# Patient Record
Sex: Female | Born: 1935 | Race: White | Hispanic: No
Health system: Southern US, Community
[De-identification: ages and names within clinical notes are randomized; demographics above are authoritative.]

## PROBLEM LIST (undated history)

## (undated) DIAGNOSIS — K219 Gastro-esophageal reflux disease without esophagitis: Secondary | ICD-10-CM

## (undated) DIAGNOSIS — C801 Malignant (primary) neoplasm, unspecified: Secondary | ICD-10-CM

## (undated) DIAGNOSIS — J189 Pneumonia, unspecified organism: Secondary | ICD-10-CM

## (undated) DIAGNOSIS — M1990 Unspecified osteoarthritis, unspecified site: Secondary | ICD-10-CM

## (undated) DIAGNOSIS — I639 Cerebral infarction, unspecified: Secondary | ICD-10-CM

## (undated) HISTORY — PX: ABDOMINAL HYSTERECTOMY: SHX81

---

## 1997-04-29 ENCOUNTER — Encounter: Admission: RE | Admit: 1997-04-29 | Discharge: 1997-07-28 | Payer: Self-pay | Admitting: Internal Medicine

## 2000-01-09 ENCOUNTER — Emergency Department (HOSPITAL_COMMUNITY): Admission: EM | Admit: 2000-01-09 | Discharge: 2000-01-10 | Payer: Self-pay | Admitting: Emergency Medicine

## 2000-03-07 ENCOUNTER — Encounter (INDEPENDENT_AMBULATORY_CARE_PROVIDER_SITE_OTHER): Payer: Self-pay | Admitting: Specialist

## 2000-03-07 ENCOUNTER — Ambulatory Visit (HOSPITAL_COMMUNITY): Admission: RE | Admit: 2000-03-07 | Discharge: 2000-03-07 | Payer: Self-pay | Admitting: *Deleted

## 2000-04-24 ENCOUNTER — Emergency Department (HOSPITAL_COMMUNITY): Admission: EM | Admit: 2000-04-24 | Discharge: 2000-04-25 | Payer: Self-pay | Admitting: Internal Medicine

## 2000-04-25 ENCOUNTER — Encounter: Payer: Self-pay | Admitting: Emergency Medicine

## 2000-04-30 ENCOUNTER — Inpatient Hospital Stay (HOSPITAL_COMMUNITY): Admission: EM | Admit: 2000-04-30 | Discharge: 2000-05-13 | Payer: Self-pay | Admitting: Emergency Medicine

## 2000-05-01 ENCOUNTER — Encounter: Payer: Self-pay | Admitting: Emergency Medicine

## 2000-05-01 ENCOUNTER — Encounter: Payer: Self-pay | Admitting: Critical Care Medicine

## 2000-05-01 ENCOUNTER — Encounter: Payer: Self-pay | Admitting: Pulmonary Disease

## 2000-05-02 ENCOUNTER — Encounter: Payer: Self-pay | Admitting: Critical Care Medicine

## 2000-05-07 ENCOUNTER — Encounter: Payer: Self-pay | Admitting: Orthopedic Surgery

## 2000-05-07 ENCOUNTER — Encounter: Payer: Self-pay | Admitting: Neurology

## 2000-05-07 ENCOUNTER — Encounter: Payer: Self-pay | Admitting: Internal Medicine

## 2000-05-10 ENCOUNTER — Encounter: Payer: Self-pay | Admitting: Internal Medicine

## 2000-05-13 ENCOUNTER — Inpatient Hospital Stay (HOSPITAL_COMMUNITY)
Admission: RE | Admit: 2000-05-13 | Discharge: 2000-05-24 | Payer: Self-pay | Admitting: Physical Medicine & Rehabilitation

## 2000-05-23 ENCOUNTER — Encounter: Payer: Self-pay | Admitting: Physical Medicine & Rehabilitation

## 2000-06-13 ENCOUNTER — Ambulatory Visit (HOSPITAL_COMMUNITY)
Admission: RE | Admit: 2000-06-13 | Discharge: 2000-06-13 | Payer: Self-pay | Admitting: Physical Medicine & Rehabilitation

## 2000-06-13 ENCOUNTER — Ambulatory Visit (HOSPITAL_COMMUNITY)
Admission: RE | Admit: 2000-06-13 | Discharge: 2000-06-17 | Payer: Self-pay | Admitting: Physical Medicine & Rehabilitation

## 2000-06-13 ENCOUNTER — Encounter: Payer: Self-pay | Admitting: Physical Medicine & Rehabilitation

## 2000-07-25 ENCOUNTER — Ambulatory Visit (HOSPITAL_COMMUNITY)
Admission: RE | Admit: 2000-07-25 | Discharge: 2000-07-25 | Payer: Self-pay | Admitting: Physical Medicine & Rehabilitation

## 2000-07-25 ENCOUNTER — Encounter: Payer: Self-pay | Admitting: Physical Medicine & Rehabilitation

## 2001-04-16 ENCOUNTER — Encounter (INDEPENDENT_AMBULATORY_CARE_PROVIDER_SITE_OTHER): Payer: Self-pay | Admitting: Specialist

## 2001-04-16 ENCOUNTER — Ambulatory Visit (HOSPITAL_COMMUNITY): Admission: RE | Admit: 2001-04-16 | Discharge: 2001-04-16 | Payer: Self-pay | Admitting: *Deleted

## 2003-12-22 ENCOUNTER — Ambulatory Visit (HOSPITAL_COMMUNITY): Admission: RE | Admit: 2003-12-22 | Discharge: 2003-12-22 | Payer: Self-pay | Admitting: *Deleted

## 2004-05-11 ENCOUNTER — Emergency Department (HOSPITAL_COMMUNITY): Admission: EM | Admit: 2004-05-11 | Discharge: 2004-05-12 | Payer: Self-pay | Admitting: Emergency Medicine

## 2004-05-30 ENCOUNTER — Ambulatory Visit (HOSPITAL_COMMUNITY): Admission: RE | Admit: 2004-05-30 | Discharge: 2004-05-30 | Payer: Self-pay | Admitting: *Deleted

## 2004-11-09 ENCOUNTER — Emergency Department (HOSPITAL_COMMUNITY): Admission: EM | Admit: 2004-11-09 | Discharge: 2004-11-09 | Payer: Self-pay | Admitting: Emergency Medicine

## 2004-11-10 ENCOUNTER — Emergency Department (HOSPITAL_COMMUNITY): Admission: EM | Admit: 2004-11-10 | Discharge: 2004-11-11 | Payer: Self-pay | Admitting: Emergency Medicine

## 2004-11-21 ENCOUNTER — Ambulatory Visit (HOSPITAL_COMMUNITY): Admission: RE | Admit: 2004-11-21 | Discharge: 2004-11-21 | Payer: Self-pay | Admitting: *Deleted

## 2005-04-24 ENCOUNTER — Ambulatory Visit (HOSPITAL_COMMUNITY): Admission: RE | Admit: 2005-04-24 | Discharge: 2005-04-24 | Payer: Self-pay | Admitting: *Deleted

## 2005-06-26 ENCOUNTER — Encounter: Admission: RE | Admit: 2005-06-26 | Discharge: 2005-06-26 | Payer: Self-pay | Admitting: Internal Medicine

## 2007-01-31 ENCOUNTER — Emergency Department (HOSPITAL_COMMUNITY): Admission: EM | Admit: 2007-01-31 | Discharge: 2007-02-01 | Payer: Self-pay | Admitting: Emergency Medicine

## 2007-02-19 ENCOUNTER — Ambulatory Visit (HOSPITAL_COMMUNITY): Admission: RE | Admit: 2007-02-19 | Discharge: 2007-02-19 | Payer: Self-pay | Admitting: *Deleted

## 2007-05-16 ENCOUNTER — Emergency Department (HOSPITAL_COMMUNITY): Admission: EM | Admit: 2007-05-16 | Discharge: 2007-05-16 | Payer: Self-pay | Admitting: Emergency Medicine

## 2007-06-25 ENCOUNTER — Ambulatory Visit (HOSPITAL_COMMUNITY): Admission: RE | Admit: 2007-06-25 | Discharge: 2007-06-25 | Payer: Self-pay | Admitting: *Deleted

## 2007-06-27 ENCOUNTER — Ambulatory Visit (HOSPITAL_COMMUNITY): Admission: RE | Admit: 2007-06-27 | Discharge: 2007-06-27 | Payer: Self-pay | Admitting: *Deleted

## 2007-12-02 ENCOUNTER — Ambulatory Visit (HOSPITAL_COMMUNITY): Admission: RE | Admit: 2007-12-02 | Discharge: 2007-12-02 | Payer: Self-pay | Admitting: *Deleted

## 2008-04-28 ENCOUNTER — Ambulatory Visit: Admission: RE | Admit: 2008-04-28 | Discharge: 2008-04-28 | Payer: Self-pay | Admitting: Gynecologic Oncology

## 2008-09-21 ENCOUNTER — Ambulatory Visit (HOSPITAL_COMMUNITY): Admission: RE | Admit: 2008-09-21 | Discharge: 2008-09-21 | Payer: Self-pay | Admitting: Gastroenterology

## 2009-03-10 ENCOUNTER — Ambulatory Visit (HOSPITAL_COMMUNITY): Admission: RE | Admit: 2009-03-10 | Discharge: 2009-03-10 | Payer: Self-pay | Admitting: Gastroenterology

## 2009-04-04 ENCOUNTER — Emergency Department (HOSPITAL_COMMUNITY): Admission: EM | Admit: 2009-04-04 | Discharge: 2009-04-05 | Payer: Self-pay | Admitting: Emergency Medicine

## 2009-04-05 ENCOUNTER — Ambulatory Visit (HOSPITAL_COMMUNITY): Admission: RE | Admit: 2009-04-05 | Discharge: 2009-04-05 | Payer: Self-pay | Admitting: Gastroenterology

## 2010-02-19 ENCOUNTER — Encounter: Payer: Self-pay | Admitting: *Deleted

## 2010-06-13 NOTE — Consult Note (Signed)
[name], [name]            ACCOUNT NO.:  [id number]   MEDICAL RECORD NO.:  [id number]          PATIENT TYPE:  OUT   LOCATION:  GYN                          FACILITY:  [facility]   PHYSICIAN:  John T. Kyla Balzarine, M.D.    DATE OF BIRTH:  May 14, 1930   DATE OF CONSULTATION:  DATE OF DISCHARGE:                                 CONSULTATION   CHIEF COMPLAINT:  This 77 year old woman is seen at the request of Dr.  Henderson Cloud for ovarian cyst.   HISTORY OF PRESENT ILLNESS:  This patient had a CVA in 1980 with  residual left hemiparesis and regurgitation.  She has an indwelling  feeding tube and during repositioning of the feeding tube, CT of abdomen  and pelvis was performed.  This revealed a 6 x 4 cm right adnexal mass.  She has a remote history of vaginal hysterectomy and possible USO in the  1970s for dysfunctional bleeding.  Evaluation by Dr. Henderson Cloud included a  vaginal probe ultrasound which revealed a 4.3 x 4.4 x 2.8 cm septated  ovarian cyst with no hypervascularity or excrescences.  The left ovary  was also identified, containing a 1.3 cm simple cyst.  No fluid was  found in the cul-de-sac.  CA-125 value was obtained and noted to be 9.7.  The patient relates that she has no pelvic pain or other pelvic  symptoms.  There has been no change in her chronic constipation and no  leg swelling or vaginal bleeding.   PAST MEDICAL HISTORY:  1. Significant for a right carotid tumor producing CVA in 1980,      treated with radiation and cancer free.  2. Prior NSVD x2.  3. Vaginal hysterectomy.  4. Gastrostomy.  5. She has osteoporosis.   MEDICATIONS:  Include diazepam, imipramine, ranitidine and monthly  Boniva.   ALLERGIES:  THE PATIENT AND HER SON STATE NONE.   PERSONAL AND SOCIAL HISTORY:  The patient denies tobacco or ethanol use.  She is wheelchair dependent.   FAMILY HISTORY:  Negative for gynecologic or breast cancer.   REVIEW OF SYSTEMS:  Other than noted above, negative 10-point  system  review.   EXAM:  VITAL SIGNS:  Stable and afebrile as recorded in the clinic chart  with blood pressure 110/66.  The patient has marked left hemiparesis with left arm strength  essentially zero and left leg strength approximately 1 out of 4.  The  patient is alert and oriented x3, in no acute distress.  LYMPH:  Survey negative for pathologic lymphadenopathy.  BACK:  No spinous or CVA tenderness.  ABDOMEN:  Soft and benign with a gastrostomy tube in the epigastrium,  without inflammation.  There is no hernia, ascites, tenderness or mass.  EXTREMITIES:  Trace left pretibial edema without cords or Homan's.  PELVIC:  External genitalia and BUS, bladder and urethra normal.  Vagina  is clear.  On bimanual and rectovaginal examinations, there is ample  stool in the rectal ampulla without palpable mass or tenderness.  Absent  uterus and cervix.   ASSESSMENT:  Small, likely benign cyst of the ovary discovered  incidentally at the  time of a CT scan; asymptomatic and with normal CA-  125 value.  Her risk of an ovarian malignancy is less than 1 in 300,  which would be much less than morbidity of even a laparoscopic procedure  in this high-risk patient.   RECOMMENDATIONS:  I had a long discussion with the patient and her son  regarding the nature of this cyst and its likely benignity.  I would not  recommend surgical intervention as this, if anything, is smaller than on  the initial CT scan, although I suspect that this merely reflects  different imaging techniques.  I recommended that the patient be seen  for vaginal probe ultrasound at approximately 63-month intervals by Dr.  Henderson Cloud for least another year and then could be followed perhaps at  annual intervals.  Indications for surgery would include enlargement  greater than 6 cm on TVS, the development of symptoms, or the  development of internal features suspicious for ovarian cancer.  We  would be glad to see her back at any time on  a p.r.n. basis.      John T. Kyla Balzarine, M.D.  Electronically Signed     JTS/MEDQ  D:  04/28/2008  T:  04/28/2008  Job:  [id number]   cc:   Guy Sandifer. Henderson Cloud, M.D.  Fax: [phone]   Soyla Murphy. Renne Crigler, M.D.  Fax: [phone]   Telford Nab, R.N.  [address]  [place], [place] [address]

## 2010-06-13 NOTE — Op Note (Signed)
[name], [name]            ACCOUNT NO.:  [id number]   MEDICAL RECORD NO.:  [id number]          PATIENT TYPE:  AMB   LOCATION:  ENDO                         FACILITY:  [facility]   PHYSICIAN:  [place] Spinner, M.D.    DATE OF BIRTH:  05/09/1930   DATE OF PROCEDURE:  DATE OF DISCHARGE:                               OPERATIVE REPORT   PROCEDURE:  Replacement of PEG tube.  Previous PEG tube had developed a  leak.   The patient was brought in and the current PEG tube was removed by  traction. I immediately placed a 22 gauge PEG tube in the same spot. It  went easily into place. The balloon inflated to 18 cc and pulled back  until there was resistance. The outside bumper was pushed down to the  skin. The wound was dressed in the usual sterile fashion. The patient  tolerated the procedure well.   PLAN:  Resume use of PEG tube.           ______________________________  [place]iana Spinner, M.D.     GMO/MEDQ  D:  02/19/2007  T:  02/19/2007  Job:  [id number]

## 2010-06-13 NOTE — Op Note (Signed)
NAMEMALIKA, [name]            ACCOUNT NO.:  [id number]   MEDICAL RECORD NO.:  [id number]          PATIENT TYPE:  AMB   LOCATION:  ENDO                         FACILITY:  [facility]   PHYSICIAN:  Petra Kuba, M.D.    DATE OF BIRTH:  Apr 23, 1930   DATE OF PROCEDURE:  09/21/2008  DATE OF DISCHARGE:                               OPERATIVE REPORT   PROCEDURE:  Percutaneous endoscopic gastrostomy tube change.   INDICATIONS:  Patient with longstanding PEG tube.  Her current one is  coming apart.  Consent was signed after risks, benefits, methods and  options were thoroughly discussed and signed by the patient in the  office.  No medication for sedation was used.   PROCEDURE:  The old PEG was removed in the customary fashion with gentle  counter pressure for less than 1 second and popping nicely after the  area was sterilely dressed and draped.  We then, at the family's  request, tried to insert a 20-French, but that caused pain, so we  switched to the 18-French, 20 mL balloon which was easily able to be  placed through the hole.  The balloon was inflated and both air and  water were flushed readily.  The proper sounds of the air were  confirmed.  The PEG was dressed and the procedure was terminated.  The  patient tolerated the procedure well.  There was no obvious immediate  complication.   ENDOSCOPIC DIAGNOSES:  1. Removed old percutaneous endoscopic gastrostomy tube with gentle      pressure in the customary fashion.  2. Unable to easily place a 20-French replacement percutaneous      endoscopic gastrostomy tube, but successfully placed the 18-French,      20 mL balloon replacement.   PLAN:  Followup p.r.n.  I would be happy to increase the size in the  future, but the patient would need sedation and probably dilation of the  site.  I have discussed that with the son.  I have also showed him how  to use the balloon PEG and change the water monthly.  He will call me  sooner p.r.n.   Return care will be with Dr. Renne Crigler.           ______________________________  Petra Kuba, M.D.     MEM/MEDQ  D:  09/21/2008  T:  09/21/2008  Job:  [id number]   cc:   Petra Kuba, M.D.  Fax: [phone]   Soyla Murphy. Renne Crigler, M.D.  Fax: [phone]

## 2010-06-16 NOTE — H&P (Signed)
[place]. [facility]  Patient:    [name], [name]                     MRN: [id number] Adm. Date:  04/30/00 Attending:  Charlcie Cradle. Delford Field, M.D. [facility] CC:         Samul Dada, M.D.  Soyla Murphy. Renne Crigler, M.D.   History and Physical  CHIEF COMPLAINT:  Respiratory distress.  Bilateral pneumonia.  HISTORY OF PRESENT ILLNESS:  A 77 year old white female with a prior history of 22 years ago of a stroke with left partial hemiparesis persisting and recent fall with a fracture of the left tibia and fibula with short leg cast in place.  The patient noted the onset a week ago of increasing shortness of breath, increasing cough, increasing chest congestion.  Symptoms culminated to the point where she was brought to urgent care tonight and was found to have bilateral pneumonia on chest x-ray and severely hypoxic.  Therefore she was sent to [facility] emergency room for further admission and evaluation.  The patient has a previous history of pneumonia in the past.  She is currently a nonsmoker.  Because of persisting symptoms and because of the severe nature of the pneumonia with impending respiratory failure she is admitted to intensive care and to the critical care service.  PAST MEDICAL HISTORY:  Medical history of CVA in the 48s with left hemiparesis.  Parotid tumor removed in the past.  Tibia/fibula fracture in March of this year with persisting cast on leg.  MEDICATIONS: 1. Valium. 2. imipramine. 3. Phenergan. 4. Percocet. 5. Nexium. 6. Fosamax. 7. Calcium.  ALLERGIES:  None.  SOCIAL HISTORY/ FAMILY HISTORY/ REVIEW OF SYSTEMS:  Currently noncontributory. The patient is a poor historian.  PHYSICAL EXAMINATION:  VITAL SIGNS:  Temperature 99.0, respirations 36, blood pressure 148/64, pulse 122.  Sinus tachycardia on telemetry.  CHEST:  Rales and rhonchi bilaterally, poor air movement.  CARDIAC:  Shows tachycardia without S3.  Normal S1, S2.  ABDOMEN:   Soft, nontender, bowel sounds active.  EXTREMITIES:  Show no edema, clubbing or venous disease.  SKIN:  Clear.  NEUROLOGIC:  Shows left upper extremity weakness, left lower extremity weakness.  A short leg cast is in place.  HEENT:  No jugular venous distension.  NO lymphadenopathy.  Oropharynx clear.  NECK:  Supple.  LABORATORY DATA:  Chest x-ray shows bilateral pulmonary infiltrates.  Sodium 136, potassium 3.3, chloride 110, CO2 31, BUN 32, creatinine 0.7, on 15 liter nonrebreather, pH 7.45, pCO2 43, pO2 105, blood sugar 127, hemoglobin 16, EKG sinus tachycardia with no ST-T wave changes.  IMPRESSION:  Bilateral community-acquired pneumonia severe with hypoxia and respiratory failure.  Rule out aspiration as cause.  Rule out underlying pulmonary embolism with recent short leg cast placement with associated deep vein thrombosis with previous history of cerebrovascular accident.  RECOMMENDATIONS: 1. Begin IV cefepime, IV Zithromax, give nebulizer treatments, administer    oxygen by nonrebreather.   May yet need mechanical ventilation.  Will place    in ICU for close observation. 2. History of cerebrovascular accident 1980, will follow. 3. Rule out underlying pulmonary embolism though doubt that based on history    will obtain spiral CT scan for this. DD:  04/30/00 TD:  05/01/00 Job: [id number]

## 2010-06-16 NOTE — Procedures (Signed)
[facility]  Patient:    [name], [name]                     MRN: [id number] Proc. Date: 03/07/00 Adm. Date:  [id number] Attending:  Sabino Gasser                           Procedure Report  PROCEDURE:  Endoscopy with biopsy.  INDICATION FOR PROCEDURE:  Vomiting.  ANESTHESIA:  Demerol 50 mg, Versed 7 mg.  DESCRIPTION OF PROCEDURE:  With the patient mildly sedated in the left lateral decubitus position, the Olympus videoscopic endoscope was inserted in the mouth and passed under direct vision through the esophagus. The distal esophagus was approached and showed short segment Barretts esophagus and this was photographed and biopsied. We then entered in the stomach, fundus, body, antrum, duodenal bulb, and second portion of the duodenum were all well visualized and appeared normal. From this point, the endoscope was slowly withdrawn taking circumferential views of the entire duodenal mucosa until the endoscope was then pulled back in the stomach, placed in retroflexion to view the stomach from below. The endoscope was then straightened and withdrawn taking circumferential views of the entire gastric and esophageal mucosa which otherwise appeared normal. The patients vital signs and pulse oximeter remained stable. The patient tolerated the procedure well without apparent complications.  FINDINGS:  Question of short segment Barretts esophagus biopsied. Await biopsy report. The patient will call me for results and follow-up with me as an outpatient. DD:  03/07/00 TD:  03/08/00 Job: [id number]

## 2010-06-16 NOTE — Procedures (Signed)
[facility]  Patient:    [name], [name] Visit Number: [id number] MRN: [id number]          Service Type: END Location: ENDO Attending Physician:  Sabino Gasser Dictated by:   Sabino Gasser, M.D. Proc. Date: 04/16/01 Admit Date:  04/16/2001                             Procedure Report  PROCEDURE:  Upper endoscopy with biopsy.  INDICATION:  GERD, rule out Barretts esophagus.  ANESTHESIA:  Demerol 50, Versed 7.5 mg.  DESCRIPTION OF PROCEDURE:  With patient mildly sedated in the left lateral decubitus position, the Olympus videoscopic endoscope was inserted into the mouth and passed under direct vision through the esophagus, where there were two areas which could represent short segment Barretts esophagus, photographed and biopsied.  We entered into the stomach; fundus, body, antrum, duodenal bulb, and second portion of duodenum all appeared normal.  From this point, the endoscope was slowly withdrawn, taking circumferential views of the entire duodenal mucosa until the endoscope then pulled back into the stomach and placed in retroflexion to view the stomach from below, and a hiatal hernia was visualized and photographed.  The endoscope was then straightened and withdrawn, taking circumferential views of the remaining gastric and esophageal mucosa, stopping to photograph along the way.  The patient had a previous PEG tube placed, and this was visualized and in good position.  The patients vital signs and pulse oximeter remained stable.  The patient tolerated the procedure well without apparent complications.  FINDINGS:  Possible short segment Barretts esophagus above a hiatal hernia, biopsied, and PEG tube in place.  PLAN:  Await biopsy report.  The patients son will call me for results and follow up with me as an outpatient. Dictated by:   Sabino Gasser, M.D. Attending Physician:  Sabino Gasser DD:  04/16/01 TD:  04/16/01 Job: [id number]

## 2010-06-16 NOTE — Consult Note (Signed)
[place]. [facility]  Patient:    [name], SEBREE                     MRN: [id number] Adm. Date:  04/30/00 Attending:  Sabino Gasser, M.D.                          Consultation Report  HISTORY OF PRESENT ILLNESS:  Ms. 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 Was admitted because of bilateral pneumonia. She had a stroke 22 years prior that left her with partial hemiparesis on the left. She recently fell and fractured the left tibia and fibula. Prior to admission, developed a shortness of breath, increasing cough, increasing chest congestion. She was brought to the emergency room and noted to be hypoxic with bilateral pneumonia on chest x-ray. She was admitted to the intensive care unit.  PAST MEDICAL HISTORY:  Notable for a medical history of CVA in the 80s as mentioned, left hemiparesis continued. She had a history of parotid tumor removed in the past.  MEDICATIONS ON ADMISSION:  Valium, imipramine, Phenergan, Percocet, Nexium, Fosamax, and calcium.  ALLERGIES:  She has no allergies.  REVIEW OF SYSTEMS, SOCIAL HISTORY, AND FAMILY HISTORY:  Taken and were currently noncontributory. The patient was a poor historian.  Subsequently, she was admitted and this was a week ago. A Panda tube had been placed. Subsequently seen by Soyla Murphy. Renne Crigler, M.D. She improved in the hospital under the treatment of the intensive care unit. Subsequently, she has had two modified barium swallows done, one on April 4, the other just today. Each has shown nonfunctional swallowing, no epiglottic movement, aspiration of puree that spilled from the piriform sinuses. Because of that, I have been asked to see her for further evaluation.  PAST MEDICAL HISTORY AND OTHERS:  As per the chart.  PHYSICAL EXAMINATION:  GENERAL:  As stated, she is alert and oriented and appears in no  distress.  HEENT:  Grossly unremarkable.  NECK:  No bruits are heard.  LUNGS:  Clear.  HEART:  Regular rhythm without murmur, gallop, or rub appreciated.  ABDOMEN:  Perfectly benign without scars.  IMPRESSION:  Oropharyngeal dysphagia. Etiology unclear. Neurology will be seeing the patient to evaluate further. Given these findings, I have been asked to see her for a PEG tube placement. She appears to a reasonable candidate given these findings.  PLAN:  Will place PEG tube. Schedule for it as soon as possible, possibly tomorrow or the next day. DD:  05/07/00 TD:  05/07/00 Job: [id number]

## 2010-06-16 NOTE — Discharge Summary (Signed)
[place]. [facility]  Patient:    [name], [name]                     MRN: [id number] Adm. Date:  [id number] Disc. Date: [id number] Attending:  Herold Harms                           Discharge Summary  ADDENDUM:  Laboratory work was not in her initial chart, therefore it had to be reprinted.  On April 30, 2000, hemoglobin was 11.2, with 83% neutrophils. White count was 5.6.  Initial sodium was 146, potassium 3.3, glucose 127, BUN 32, creatinine was 0.7.  Urinalysis showed 30 mg/dl of protein and 4403 RBC/hpf.  By April 13th, her potassium was 4.0 and hemoglobin was 10.1 with white count of 7.5.  Addendum to the diagnoses to include hypokalemia - treated and mildly impaired glucose tolerance. DD:  06/07/00 TD:  06/10/00 Job: [id number]

## 2010-06-16 NOTE — Discharge Summary (Signed)
[place]. [facility]  Patient:    [name], [name]                     MRN: [id number] Adm. Date:  [id number] Disc. Date: [id number] Attending:  Herold Harms Dictator:   Dian Situ, PA CC:         Soyla Murphy. Renne Crigler, M.D.  Adelene Amas. Williford, M.D.  Sabino Gasser, M.D.  Alinda Deem, M.D.   Discharge Summary  DISCHARGE DIAGNOSES: 1. Deconditioning past pneumonia. 2. History of depression. 3. Severe dysphagia. 4. Left tibia-fibula fracture. 5. Anemia. 6. Enterococcus urinary tract infection, treated.  HISTORY OF PRESENT ILLNESS:  [name] is a 77 year old female with history of CVA with left hemiparesis, recent left tib-fib fracture admitted April 30, 2000, with increased complaints of shortness of breath, cough and congestion.  she was noted to be hypoxic with chest x-ray showing bilateral pneumonia as question of PE.  Spiral CT was done and was negative.  She was followed by critical care medicine initially, treated with IV antibiotics with question of aspiration pneumonia.  MBS was done and showed no epiglottic movement with spillage and aspiration.  PEG placement was recommended.  Sabino Gasser, M.D., was consulted and PEG placed on May 08, 2000.  Neurology was consulted for input as question of CVA as cause of dysphagia.  MRI done showed right ICA occlusion with encephalomalacia and gliosis of right hemisphere with question due to re-enlargement, no acute infarct.  Repeat showed no changes in right anterior circulation.  She has history of chronic dysphagia which is probably multifactorial.  She was noted to have depressed mood and Adelene Amas. Williford, M.D., was consulted and her Imipramine was reinitiated. Orthopedics was consulted for input regarding weightbearing status and the patients left tib-fib fracture and they recommend nonweightbearing status for the next three to five weeks.  She has required much encouragement  for participation in therapy with moods slowly improving, currently is mod. assist for transfers.  PAST MEDICAL HISTORY:  Significant for CVA with left hemiparesis, history of major depression, history of glomus tumor status post XRT, osteoporosis, GERD, and suspicion of carotid tumor.  ALLERGIES:  No known drug allergies.  SOCIAL HISTORY:  The patient lives with her son and was independent until her fracture a month ago.  She was able to ambulate with a hemiwalker.  She does not use any tobacco or alcohol.  Son is able to assist as needed.  HOSPITAL COURSE:  [name] was admitted to rehab on May 13, 2000, for inpatient therapy to consist of PT and OT daily.  Past admission, the patient was maintained on NPO status.  Tube feedings were cut down to five per day secondary to complaints of increase in patients nausea symptoms as well as to assist patient getting better sleep at nights.  Labs at admission showed H&H at 10.0 and 30.4, white count 5.6.  Electrolytes were within normal limits.  The patient has required much encouragement in participating in therapies secondary to primary focus of being able to get back home.  Issues during this stay have been the problems of urinary retention that did improve with initiation of Urecholine.  Enterococcus UTI at the time of admission and has been treated with Macrodantin x 7 days.  She did develop some erythema induration around her PEG site that was treated with Keflex and by the time of discharge the patients PEG site is clean and dry with resolution of erythema.  Gladstone Pih, M.D., of neuropsychology was consulted for input and support during her stay and he has continued to follow along.  He reports the patients mood is noted to be improved.  Speech therapy has been ongoing with _________ to help the patient with dysphagia.  Minimal changes in response noted.  The p.o. trials were not done secondary to  severe dysphagia. Repeat follow-up MBS is set for May 14, 2000.  Blood pressure to continue to receive home health speech therapy for _______ treatment past discharge.  Currently the patient is at supervision for self care needs.  She is modified independent for bed mobility, supervision for transfers, is able to stand at nonweightbearing status for 90 seconds at two repetitions.  The patient to continue with home health PT and OT provided by [facility]. A home health R.N. has been arranged to follow up with questions regarding tube feeds and follow up on PEG site.  Of note, follow-up chest x-ray of May 23, 2000, shows near complete resolution of bilateral air space disease edema.  On May 24, 2000, the patient is discharged to home. DD:  05/24/00 TD:  05/27/00 Job: [id number]

## 2010-06-16 NOTE — Discharge Summary (Signed)
[place]. [facility]  Patient:    [name], [name]                     MRN: [id number] Adm. Date:  [id number] Disc. Date: [id number] Attending:  Herold Harms                           Discharge Summary  RADIOLOGICAL FINDINGS: 1. Computed tomography of chest with contrast showing bilateral pneumonia,    mediastinal and bilateral hilar adenopathy most likely reactive, no    pulmonary emboli, diffuse fatty infiltration of liver. 2. Lower extremity computed tomography with no deep venous thrombosis. 3. Barium swallow with marked pooling of barium within the hypopharynx with    penetration of upper airway occurring with barium pudding consistency.  The    study was discontinued. 4. Magnetic resonance imaging of right brain showed right internal carotid    artery occlusion, supply to right anterior circulation achieved by patent    anterior posterior communicating arteries.  It appears there may be some    stenosis in the right right mastoid (M2) region.  The remainder of the    vessels appear patent and unremarkable.  No change in magnetic resonance    imaging since May 02, 2000.  No evidence of acute or subacute extension of    the patients chronic ischemic changes as described in detail in the note. 5. Ankle trimalleolar fracture obscured by cast. 6. Electrocardiogram with sinus tachycardia, otherwise normal.  HOSPITAL COURSE:  Please see admission History and Physical for details in Ms. Gilliam presentation.  Briefly, she had respiratory distress and was found to have bilateral pneumonia and was treated with IV antibiotics.  Her swallowing studies suggested that aspiration was the probable cause.  It was unclear as to why she had the deterioration in her swallowing function. Initially, this was treated by the ICU team with Dr. Shelle Iron and Dr. Delford Field. She was transferred to the internal medicine service on May 06, 2000.  Her oxygenation improved rapidly.   She initially resisted having a feeding tube. Also during this stay, she had some depressed and paranoid ideation.  She was seen in consultation by Dr. Jeanie Sewer of psychiatry for this who made some changes in her antidepressant therapy.  Dr. Sandria Manly, of neurology, also felt that temporary PEG tube would be the best answer with follow up barium swallow to be performed in 8-12 weeks.  The PEG tube was placed on April 10, by Dr. Virginia Rochester. She was seen in consultation also by the rehabilitation service who accepted her in transfer on May 13, 2000.  DISCHARGE DIAGNOSES: 1. Bilateral aspiration pneumonia. 2. Severe dysphagia requiring percutaneous endoscopic gastrostomy tube tube    placement. 3. 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 4. Radiation therapy for glomus tumor. 5. History of parotid surgery. 6. History of left tibiofibular fracture, recent. 7. Depression. 8. Chronic nausea. 9. General debilitation.  DISPOSITION:  She was transferred to the rehabilitation service on May 13, 2000, in improved condition.DD:  06/07/00 TD:  06/10/00 Job: [id number]

## 2010-06-16 NOTE — Procedures (Signed)
[place]. [facility]  Patient:    [name], [name]                     MRN: [id number] Adm. Date:  [id number] Attending:  Caleb Popp                           Procedure Report  PROCEDURE:  Percutaneous endoscopic gastrostomy placement.  INDICATIONS:  Dysphagia, see previous note.  ANESTHESIA:  Demerol 50 mg, Versed 10 mg, given intravenously in divided dose.  ANTIBIOTICS:  Preoperative Ancef 1 g.  DESCRIPTION OF PROCEDURE:  With patient mildly sedated in the recumbent position, the Olympus videoscopic endoscope was inserted in the mouth, passed under direct vision through the esophagus, which appeared normal into the stomach, which also appeared normal, passed into the duodenal bulb, second portion of the duodenum, all of which appeared normal, and representative photographs were taken.  From this point, the endoscope was slowly withdrawn, taking circumferential views of the entire duodenal mucosa until the endoscope had been pulled back into the stomach, placed in retroflexion to view the stomach from below, and this too appeared normal.  The endoscope was then straightened and pulled back, taking circumferential views of the gastric mucosa and allowing transillumination to occur on the abdominal wall.  Once this was noted and palpation of the abdominal wall could be seen endoscopically at an appropriate place in the stomach, this area was then chosen as a site for the PEG tube.  Skin was then prepped and draped in the usual sterile fashion, and Xylocaine was injected into the skin to raise a wheal.  Subsequently a short horizontal incision was made, through which the trocar was advanced and seen to enter directly into the stomach.  Through the trocar was passed the guidewire, which was grasped by a snare, removed endoscopically through the mouth.  To the guidewire was attached the Wilson-Cook 24 French pull PEG tube and pulled into place  easily.  While the skin was being dressed in the usual sterile fashion, the endoscope was reinserted, seeing that the PEG was sited correctly, photograph taken.  The endoscope was withdrawn, taking circumferential views of the remaining gastric and esophageal mucosa, which otherwise appeared normal.  The patients vital signs and pulse oximetry remained stable.  The patient tolerated the procedure well without apparent complications.  FINDINGS:  Unremarkable endoscopic examination with successful PEG tube placement.  PLAN:  Begin use of PEG tube. DD:  05/08/00 TD:  05/08/00 Job: [id number]

## 2010-06-16 NOTE — Op Note (Signed)
NAMEARABEL, [name]            ACCOUNT NO.:  [id number]   MEDICAL RECORD NO.:  [id number]          PATIENT TYPE:  AMB   LOCATION:  ENDO                         FACILITY:  [facility]   PHYSICIAN:  Georgiana Spinner, M.D.    DATE OF BIRTH:  May 05, 1930   DATE OF PROCEDURE:  04/24/2005  DATE OF DISCHARGE:                                 OPERATIVE REPORT   PROCEDURE:  Upper endoscopy with percutaneous endoscopic gastrostomy tube  placement.   INDICATIONS:  Dysphagia with previous percutaneous endoscopic gastrostomy  tube that had come out.   ANESTHESIA:  Fentanyl 150 mcg, Versed 9 mg,  preoperative Cipro 400 mg IV  was given.   PROCEDURE:  With the patient mildly sedated in the left lateral decubitus  position, the Olympus videoscopic endoscope was inserted in the mouth and  passed then under direct vision into the esophagus and subsequently stomach  and duodenal bulb.  Subsequently, the patient was then rolled to her back as  the endoscope was pulled back into the stomach and placed in retroflexion to  view the stomach from below.  The endoscope was then straightened and pulled  back from distal to proximal stomach, allowing transillumination to occur on  the abdominal wall where it did and was seen on the abdominal wall just  medial and superior to the previous incision site. We elected to choose this  spot for the new PEG placement site.  The skin was then prepped and draped  in the usual sterile fashion.  Xylocaine was injected into the skin to make  a wheel and a short horizontal incision was made. Through this was advanced  the trocar which was seen to enter directly into the stomach.  The stylet  was removed, the guidewire was passed through the trocar, and grasped with a  snare, pulled out through the mouth. To this was attached the 24 French pull  PEG tube that was pulled into position at the 3 cm marking on the tube and  felt to be a good location here.  The skin was then dressed  in usual sterile  fashion.  The patient's vital signs and pulse oximeter remained stable.  The  patient tolerated procedure well without apparent complications.   FINDINGS:  Unremarkable endoscopic examination with successful PEG tube  placement.   PLAN:  Will begin use of PEG tube as needed.           ______________________________  Georgiana Spinner, M.D.     GMO/MEDQ  D:  04/24/2005  T:  04/25/2005  Job:  [id number]

## 2010-06-16 NOTE — Consult Note (Signed)
[place]. [facility]  Patient:    [name], [name]                     MRN: [id number] Adm. Date:  [id number] Attending:  Caleb Popp CC:         Soyla Murphy. Renne Crigler, M.D.   Consultation Report  DATE OF BIRTH:  1930/03/18  INDICATIONS:  This is the first [facility] admission for this 77 year old right-handed white widowed female whom I am asked to see for evaluation of dysphagia.  HISTORY OF PRESENT ILLNESS:  This patient formally lived in [place] and moved with her husband to [place], [place] where he subsequently died.  She has been living with her son.  In 1979, she had a right brain stroke and in 1980 was found to have a glomus tumor involving the right side of her neck.  She underwent radiation at that time.  She has had a persistent left hemiparesis and dysphagia since her stroke in 1979.  She has had episodes of coughing and clearing her throat, according to her son, and has always been able to clear herself very well, most likely related to her ability to ambulate and get around.  She fell recently, fracturing her left tibia and fibula and has had a short leg cast placed.  Over the week prior to admission, she had increasing shortness of breath, coughing, and increased chest congestion and was admitted on April 30, 2000 from the emergency room with evidence of bilateral pneumonia thought to be secondary to aspiration.  She had been on Percocet for pain and, according to her son, had fallen asleep at times with food in her mouth.  She has not had any other new recent complaints.  She has no known history of high blood pressure, diabetes, or heart disease.  As a person with a left hemiparesis, she has been able to dress herself, feed herself, bathe herself, and take care of her toilet needs. She has used a type of walker for ambulation.  HOME MEDICATIONS: 1. Diazepam. 2. Imipramine. 3. Promethazine. 4.  Nexium. 5. Fosamax. 6. Calcium.  SOCIAL HISTORY:  She does not smoke cigarettes.  She does not drink alcohol.  ALLERGIES:  She has no known drug allergies.  LABORATORY DATA:  Her examination in the hospital has included a swallowing study on May 01, 2000 showing marked pooling of barium within the hypopharynx with residual penetration of the upper airway with barium pudding consistency. The study was discontinued and the hypopharynx was suctioned to remove much of the retained barium as possible.  She therefore had a nonfunctioning swallowing mechanism on May 01, 2000.  A repeat evaluation on May 07, 2000 again showed nonfunctional swallowing, the same as on the previous study of May 01, 2000, and a consult with Dr. Virginia Rochester, gastroenterologist, for placement of a percutaneous endoscopy tube has been made.  She has had a CT scan, spiral type, without evidence of documented pulmonary emboli in the hospital.  She has exhibited symptoms and signs of depression in the hospital.  She denies any new symptoms of stroke but states that she has been having headaches daily since she has been in the hospital.  MEDICATIONS IN THE HOSPITAL:  Her medication list in the hospital includes:  1. Lovenox 40 mg q.24h.  2. Ventolin 2.5 mg/3 mL q.6h.  3. Atrovent 0.5 mg q.6h.  4. Sodium chloride q.12h.  5. Fleets Enema.  6. Tylenol.  7.  Maxipime 1 g q.12h.  8. Jevity Plus q.15h.  9. IV fluids with D5 in half-normal saline. 10. Pepcid. 11. Albuterol inhaler p.r.n.  PHYSICAL EXAMINATION:  GENERAL:  A well-developed white female with a depressed affect.  VITAL SIGNS:  Temperature 98.2 degrees.  Her blood pressure sitting in the right and in the left arm was 120/60 and 130/60.  She was not able to stand. Heart rate was 84.  There were no bruits.  NEUROLOGIC:  Mental status:  She was alert and oriented x 3.  She knew the president but not the Herbalist.  She knew the number  of nickels in $1 but not in $1.25.  She could remember 1/3 objects at five minutes.  She could spell world and earth forward.  She could not spell world backwards but could spell earth backwards.  She knew the day of the week, the date, the city, but not the county.  She did know the state.  There was no evidence of an aphasia and there was no agnosia and no apraxia.  Her cranial nerve examination revealed the visual fields to be full.  The pupils were reactive.  The extraocular movements were full.  There was a left central seventh.  She had a dysarthria and a dysphagia with poor elevation of her soft palate.  She did have gags but they were depressed bilaterally.  The left gag seemed to be more responsive than the ______ gag.  She had decreased left shoulder shrug.  Hearing was decreased with air conduction greater than bone conduction.  Her motor examination revealed flaccid left arm, some 3/5 strength proximally in the left leg.  Left foot was in a cast.  She felt pinprick, vibration, and joint position; less on the left than the right. Joint position was less well performed in the toe and finger of the left side as compared to the right.  Right plantar response was downgoing, left plantar response was not evaluated, and left ankle jerk was not evaluated because of the cast.  IMPRESSION: 1. Dysphagia, code 787.2. 2. Old right brain stroke, code 434.01. 3. History of glomus tumor. 4. Left leg fracture. 5. Depression, code 311.  PLAN:  The plan at this time is to obtain an MRI study to look for further evidence of stroke.  I am suspicious, however, that we may be dealing with a persistent dysphagia with multifocal etiology including stroke, medication effects, and generalized weakness. DD:  05/07/00 TD:  05/07/00 Job: [id number]

## 2010-10-19 LAB — COMPREHENSIVE METABOLIC PANEL
AST: 20
Alkaline Phosphatase: 67
Calcium: 9.5
Chloride: 102
Creatinine, Ser: 0.83
GFR calc Af Amer: >60
GFR calc non Af Amer: >60
Potassium: 4.1
Sodium: 139

## 2010-10-19 LAB — CBC
HCT: 37.6
Hemoglobin: 12.6
MCHC: 33.6
MCV: 84.4
Platelets: 189
RBC: 4.45
RDW: 14.2
WBC: 4.5

## 2010-10-19 LAB — DIFFERENTIAL
Basophils Absolute: 0
Basophils Relative: 0
Eosinophils Absolute: 0
Eosinophils Relative: 1
Lymphocytes Relative: 17
Lymphs Abs: 0.7
Monocytes Absolute: 0.3
Monocytes Relative: 6
Neutro Abs: 3.4
Neutrophils Relative %: 76

## 2010-10-19 LAB — URINALYSIS, ROUTINE W REFLEX MICROSCOPIC
Bilirubin Urine: NEGATIVE
Glucose, UA: NEGATIVE
Hgb urine dipstick: NEGATIVE
Nitrite: NEGATIVE
Protein, ur: NEGATIVE
Specific Gravity, Urine: 1.013
Urobilinogen, UA: 0.2
pH: 7.5

## 2010-10-19 LAB — URINE CULTURE
Colony Count: NO GROWTH
Culture: NO GROWTH

## 2010-10-19 LAB — COMPREHENSIVE METABOLIC PANEL WITH GFR
ALT: 13
Albumin: 3.6
BUN: 11
CO2: 31
Glucose, Bld: 129 — ABNORMAL HIGH
Total Bilirubin: 0.6
Total Protein: 6.9

## 2010-10-25 LAB — CREATININE, SERUM
GFR calc Af Amer: >60
GFR calc non Af Amer: 55 — ABNORMAL LOW

## 2010-10-25 LAB — BUN: BUN: 20

## 2011-03-13 ENCOUNTER — Ambulatory Visit (HOSPITAL_COMMUNITY)
Admission: RE | Admit: 2011-03-13 | Discharge: 2011-03-13 | Disposition: A | Discharge status: Home / Self Care | Payer: PRIVATE HEALTH INSURANCE | Source: Ambulatory Visit | Attending: Gastroenterology | Admitting: Gastroenterology

## 2011-03-13 ENCOUNTER — Other Ambulatory Visit (HOSPITAL_COMMUNITY): Payer: Self-pay | Admitting: Gastroenterology

## 2011-03-13 DIAGNOSIS — R6251 Failure to thrive (child): Secondary | ICD-10-CM

## 2011-03-13 DIAGNOSIS — Z431 Encounter for attention to gastrostomy: Secondary | ICD-10-CM | POA: Insufficient documentation

## 2011-03-13 MED ORDER — CHLORHEXIDINE GLUCONATE 4 % EX LIQD
CUTANEOUS | Status: AC
  Filled 2011-03-13: qty 30

## 2011-03-13 MED ORDER — IOHEXOL 300 MG/ML  SOLN
50.0000 mL | Freq: Once | INTRAMUSCULAR | Status: AC | PRN
  Administered 2011-03-13: 10 mL via INTRAVENOUS

## 2011-03-13 NOTE — Procedures (Signed)
Procedure:  Gastrostomy replacement Findings:  Perc tract recanalized and dilated up to 20Fr.  New 18Fr balloon retention gastrostomy placed.  Tip in stomach w/ fluoro confirmation after contrast injection. OK to use.

## 2011-03-14 ENCOUNTER — Telehealth (HOSPITAL_COMMUNITY): Payer: Self-pay | Admitting: *Deleted

## 2011-03-14 NOTE — Telephone Encounter (Signed)
Post procedure follow up call, no answer voice mail left. 

## 2012-02-06 ENCOUNTER — Ambulatory Visit (HOSPITAL_COMMUNITY)
Admission: AD | Admit: 2012-02-06 | Discharge: 2012-02-06 | Disposition: A | Discharge status: Home / Self Care | Payer: PRIVATE HEALTH INSURANCE | Source: Ambulatory Visit | Attending: Gastroenterology | Admitting: Gastroenterology

## 2012-02-06 ENCOUNTER — Encounter (HOSPITAL_COMMUNITY)
Admission: AD | Disposition: A | Discharge status: Home / Self Care | Payer: Self-pay | Source: Ambulatory Visit | Attending: Gastroenterology

## 2012-02-06 ENCOUNTER — Encounter (HOSPITAL_COMMUNITY): Payer: Self-pay | Admitting: Gastroenterology

## 2012-02-06 DIAGNOSIS — R633 Feeding difficulties, unspecified: Secondary | ICD-10-CM | POA: Insufficient documentation

## 2012-02-06 DIAGNOSIS — Z431 Encounter for attention to gastrostomy: Secondary | ICD-10-CM | POA: Insufficient documentation

## 2012-02-06 HISTORY — DX: Malignant (primary) neoplasm, unspecified: C80.1

## 2012-02-06 HISTORY — DX: Cerebral infarction, unspecified: I63.9

## 2012-02-06 HISTORY — DX: Unspecified osteoarthritis, unspecified site: M19.90

## 2012-02-06 HISTORY — DX: Gastro-esophageal reflux disease without esophagitis: K21.9

## 2012-02-06 HISTORY — PX: PEG PLACEMENT: SHX5437

## 2012-02-06 SURGERY — REPLACEMENT, PEG TUBE, WITHOUT ENDOSCOPY

## 2012-02-06 MED ORDER — SODIUM CHLORIDE 0.9 % IV SOLN: INTRAVENOUS | Status: DC

## 2012-02-06 NOTE — Op Note (Signed)
[facility] [address] [place], [address]   OPERATIVE PROCEDURE REPORT  PATIENT: Courtney, Patel  MR#: [id number] BIRTHDATE: [birth year]  GENDER: Female ENDOSCOPIST: Vida Rigger, MD REFERRED BY: PROCEDURE DATE:  02/06/2012 PROCEDURE: Peg Placement  and dilation of tract ASA CLASS:   Class II INDICATIONS:PEG tube fell out MEDICATIONS:      none TOPICAL ANESTHETIC:      none  DESCRIPTION OF PROCEDURE:  A history and physical had previously been performed.  The procedure, indications, potential complications , and alternatives were explained to the patient who appeared to understand.  Opportunity for questions was provided and informed consent was obtained.  The existing PEG tube tract was only showing a small opening and after sterilizing the area we placed the tip of the 6 mm Savary dilator into the opening without resistance or problem and then we placed the 5.5 cm 6-8 adjustable balloon into the opening and inflated it for 30 seconds at 6 mm and then 30 seconds at 8 mm without problem and then we were easily able to place the.  Ross 18 Fr replacement PEGin the customary fashion. The G-tube insertion site was then cleansed,  and the external bolster was placed over the tube to secure it to the abdominal wall.  A sterile dressing was then applied, and the procedure terminated.the PEG was successfully flushed without problem  COMPLICATIONS: There were no complications. ENDOSCOPIC IMPRESSION:successful replacement of PEG after tract dilation as above  RECOMMENDATIONS: follow PEG suggestions okay to use PEG now and have discussed electively increasing the size of the PEG with the patient and her son  REPEAT EXAM:   ___________________________________ eSigned:  Vida Rigger, MD 02/06/2012 11:38 AM   CC:

## 2012-02-06 NOTE — Progress Notes (Signed)
PROCEDURE NOTE  02/06/2012  10:53 AM  PATIENT:  [name]  79 y.o. female  PRE-OPERATIVE DIAGNOSIS:  Feeding problem  POST-OPERATIVE DIAGNOSIS: Same  PROCEDURE: Old PEG site dilated and replacement PEG placed  SURGEON:  Surgeon(s): Petra Kuba, MD  ASSESSMENT/FINDINGS:  Using sterile technique based on her old PEG opening being very small we initially placed the tip of the 6 mm Savary dilator through the PEG opening and it went smoothly and easily without pain and we then placed a 5.5 cm adjustable dilation balloon and initially inflated to 6 mm and held it for 30 seconds and then increase the balloon to 8 mm for 30 seconds and then were easily able to place the 18 French 5 cc balloon PEG replacement in the customary fashion without resistance and the PEG was dressed after it was successfully flushed without problem PLAN OF CARE: Okay to use new PEG and will try to get them to increase size in the future

## 2012-02-06 NOTE — Progress Notes (Signed)
[name] 10:22 AM  Subjective: Patient has been in her normal state of health without any changes but woke up today with her PEG tube out and we are requested to replace it  and her history was rediscussed Objective: Vital signs stable afebrile no acute distress lungs are clear regular rate and rhythm abdomen is soft nontende PEG site with a little bit of blood and edema and minimal opening  Assessment: Status post PEG falling out  Plan: Will plan to dilate the tract and replace PEG and this was rediscussed with the patient and her son on the phone by my nurse  [facility] E

## 2012-02-07 ENCOUNTER — Encounter (HOSPITAL_COMMUNITY): Payer: Self-pay | Admitting: Gastroenterology

## 2012-08-08 ENCOUNTER — Emergency Department (HOSPITAL_COMMUNITY)
Admission: EM | Admit: 2012-08-08 | Discharge: 2012-08-09 | Disposition: A | Discharge status: Home / Self Care | Payer: Medicare Other | Attending: Emergency Medicine | Admitting: Emergency Medicine

## 2012-08-08 DIAGNOSIS — K219 Gastro-esophageal reflux disease without esophagitis: Secondary | ICD-10-CM | POA: Insufficient documentation

## 2012-08-08 DIAGNOSIS — K9420 Gastrostomy complication, unspecified: Secondary | ICD-10-CM | POA: Insufficient documentation

## 2012-08-08 DIAGNOSIS — Z8673 Personal history of transient ischemic attack (TIA), and cerebral infarction without residual deficits: Secondary | ICD-10-CM | POA: Insufficient documentation

## 2012-08-08 DIAGNOSIS — Z79899 Other long term (current) drug therapy: Secondary | ICD-10-CM | POA: Insufficient documentation

## 2012-08-08 DIAGNOSIS — M129 Arthropathy, unspecified: Secondary | ICD-10-CM | POA: Insufficient documentation

## 2012-08-08 DIAGNOSIS — Z859 Personal history of malignant neoplasm, unspecified: Secondary | ICD-10-CM | POA: Insufficient documentation

## 2012-08-08 NOTE — ED Notes (Signed)
Patient presents to ED via [facility]. Patient c/o of feeding tube "coming out" this afternoon and needs to have it replaced. Denies any pain at this time. VSS. A&Ox4 upon arrival to ED.

## 2012-08-08 NOTE — ED Provider Notes (Signed)
History    CSN: [id number] Arrival date & time 08/08/12  Avon Gully  First MD Initiated Contact with Patient 08/08/12 2148     Chief Complaint  Patient presents with  . feeding tube needs to be replaced.    HPI  History provided by the patient. Patient is 79 year old female presenting with requests for feeding tube placement. Patient states her feeding tube came out while at home earlier today prior to arrival. She denies any other complaints. She denies any pain to the site or general abdominal pain. Denies any fever, chills or sweats. No nausea vomiting. Symptoms are mild. No other aggravating or alleviating factors. No other associated symptoms.     Past Medical History  Diagnosis Date  . Stroke   . GERD (gastroesophageal reflux disease)   . Arthritis   . Cancer    Past Surgical History  Procedure Laterality Date  . Abdominal hysterectomy    . Peg placement  02/06/2012    Procedure: PERCUTANEOUS ENDOSCOPIC GASTROSTOMY (PEG) REPLACEMENT;  Surgeon: Petra Kuba, MD;  Location: [facility] ENDOSCOPY;  Service: Endoscopy;  Laterality: N/A;  needs F 18 repl;acement gastrostomy tube   No family history on file. History  Substance Use Topics  . Smoking status: Not on file  . Smokeless tobacco: Not on file  . Alcohol Use: No   OB History   Grav Para Term Preterm Abortions TAB SAB Ect Mult Living                 Review of Systems  Constitutional: Negative for fever, chills and diaphoresis.  Gastrointestinal: Negative for abdominal pain.  All other systems reviewed and are negative.    Allergies  Review of patient's allergies indicates no known allergies.  Home Medications   Current Outpatient Rx  Name  Route  Sig  Dispense  Refill  . diazepam (VALIUM) 5 MG tablet   Oral   Take 5 mg by mouth 2 (two) times daily. 1/2 tab         . diphenhydrAMINE (BENADRYL) 25 mg capsule   Oral   Take 25 mg by mouth 2 (two) times daily.         Marland Kitchen imipramine (TOFRANIL) 10 MG tablet    Oral   Take 25 mg by mouth at bedtime. 100 mg         . ranitidine (ZANTAC) 75 MG tablet   Oral   Take 75 mg by mouth 2 (two) times daily.          BP 145/63  Pulse 96  Temp(Src) 98.1 F (36.7 C) (Oral)  Resp 16  SpO2 95% Physical Exam  Nursing note and vitals reviewed. Constitutional: She is oriented to person, place, and time. She appears well-developed and well-nourished. No distress.  HENT:  Head: Normocephalic.  Cardiovascular: Normal rate and regular rhythm.   Pulmonary/Chest: Effort normal and breath sounds normal.  Abdominal: Soft. She exhibits no distension. There is no tenderness. There is no rebound and no guarding.  Ostomy for feeding tube in the left upper quadrant slight small amount of drainage. No bleeding or purulence. Skin normal. No tenderness.  Musculoskeletal: Normal range of motion.  Neurological: She is alert and oriented to person, place, and time.  Baseline movements and extremities  Skin: Skin is warm and dry. No rash noted.  Psychiatric: She has a normal mood and affect. Her behavior is normal.    ED Course  FEEDING TUBE REPLACEMENT Date/Time: 08/09/2012 12:10 AM Performed by: Ivonne Andrew  S Authorized by: Angus Seller Consent: Verbal consent obtained. Risks and benefits: risks, benefits and alternatives were discussed Consent given by: patient Patient identity confirmed: verbally with patient Time out: Immediately prior to procedure a "time out" was called to verify the correct patient, procedure, equipment, support staff and site/side marked as required. Preparation: Patient was prepped and draped in the usual sterile fashion. Indications: tube removed by patient Tube type: gastrostomy Patient position: supine Procedure type: replacement Tube size: 18 Fr Bulb inflation volume: 10 (ml) Bulb inflation fluid: normal saline Placement/position confirmation: x-ray Tube placement difficulty: minimal Patient tolerance: Patient tolerated  the procedure well with no immediate complications.      Dg Abd 1 View  08/09/2012   *RADIOLOGY REPORT*  Clinical Data: Feeding tube placement.  ABDOMEN - 1 VIEW  Comparison: 03/13/2011.  Findings: Feeding tube tip in the gastric fundus, confirmed by injected contrast.  Reflux of a small amount of contrast into the distal esophagus.  Normal bowel gas pattern with mildly prominent stool.  Lumbosacral facet degenerative changes.  IMPRESSION:  1.  Feeding tube tip in the gastric fundus. 2.  Prominent stool.   Original Report Authenticated By: Beckie Salts, M.D.      1. Complication of feeding tube       MDM  10:00 PM patient seen and evaluated. Patient appears well no other complaints aside from feeding tube dislodged.    Angus Seller, PA-C 08/09/12 [phone]

## 2012-08-09 ENCOUNTER — Emergency Department (HOSPITAL_COMMUNITY): Payer: Medicare Other

## 2012-08-09 MED ORDER — DIAZEPAM 5 MG PO TABS
5.0000 mg | ORAL_TABLET | Freq: Once | ORAL | Status: AC
  Administered 2012-08-09: 5 mg via ORAL
  Filled 2012-08-09: qty 1

## 2012-08-09 NOTE — ED Notes (Signed)
PT and pt's son comfortable with d/c and f/u instructions.

## 2012-08-09 NOTE — ED Provider Notes (Signed)
Medical screening examination/treatment/procedure(s) were conducted as a shared visit with non-physician practitioner(s) and myself.  I personally evaluated the patient during the encounter  g tube replaced  Lyanne Co, MD 08/09/12 [phone]

## 2012-08-09 NOTE — ED Notes (Signed)
Feeding tube replaced by Theron Arista PA.

## 2013-06-01 ENCOUNTER — Encounter (HOSPITAL_COMMUNITY): Payer: Self-pay | Admitting: Emergency Medicine

## 2013-06-01 ENCOUNTER — Emergency Department (HOSPITAL_COMMUNITY): Payer: Medicare Other

## 2013-06-01 ENCOUNTER — Emergency Department (HOSPITAL_COMMUNITY)
Admission: EM | Admit: 2013-06-01 | Discharge: 2013-06-01 | Disposition: A | Discharge status: Home / Self Care | Payer: Medicare Other | Attending: Emergency Medicine | Admitting: Emergency Medicine

## 2013-06-01 DIAGNOSIS — R0981 Nasal congestion: Secondary | ICD-10-CM

## 2013-06-01 DIAGNOSIS — Z79899 Other long term (current) drug therapy: Secondary | ICD-10-CM | POA: Insufficient documentation

## 2013-06-01 DIAGNOSIS — Z859 Personal history of malignant neoplasm, unspecified: Secondary | ICD-10-CM | POA: Insufficient documentation

## 2013-06-01 DIAGNOSIS — R05 Cough: Secondary | ICD-10-CM | POA: Insufficient documentation

## 2013-06-01 DIAGNOSIS — R0602 Shortness of breath: Secondary | ICD-10-CM | POA: Insufficient documentation

## 2013-06-01 DIAGNOSIS — R059 Cough, unspecified: Secondary | ICD-10-CM | POA: Insufficient documentation

## 2013-06-01 DIAGNOSIS — R5381 Other malaise: Secondary | ICD-10-CM | POA: Insufficient documentation

## 2013-06-01 DIAGNOSIS — Z8701 Personal history of pneumonia (recurrent): Secondary | ICD-10-CM | POA: Insufficient documentation

## 2013-06-01 DIAGNOSIS — R5383 Other fatigue: Principal | ICD-10-CM

## 2013-06-01 DIAGNOSIS — I69959 Hemiplegia and hemiparesis following unspecified cerebrovascular disease affecting unspecified side: Secondary | ICD-10-CM | POA: Insufficient documentation

## 2013-06-01 DIAGNOSIS — R531 Weakness: Secondary | ICD-10-CM

## 2013-06-01 DIAGNOSIS — J3489 Other specified disorders of nose and nasal sinuses: Secondary | ICD-10-CM | POA: Insufficient documentation

## 2013-06-01 DIAGNOSIS — K219 Gastro-esophageal reflux disease without esophagitis: Secondary | ICD-10-CM | POA: Insufficient documentation

## 2013-06-01 DIAGNOSIS — Z8739 Personal history of other diseases of the musculoskeletal system and connective tissue: Secondary | ICD-10-CM | POA: Insufficient documentation

## 2013-06-01 LAB — CBC WITH DIFFERENTIAL/PLATELET
Basophils Absolute: 0 10*3/uL (ref 0.0–0.1)
Basophils Relative: 1 % (ref 0–1)
Eosinophils Absolute: 0.2 10*3/uL (ref 0.0–0.7)
Eosinophils Relative: 4 % (ref 0–5)
HCT: 37 % (ref 36.0–46.0)
Hemoglobin: 11.6 g/dL — ABNORMAL LOW (ref 12.0–15.0)
Lymphocytes Relative: 22 % (ref 12–46)
Lymphs Abs: 1.1 10*3/uL (ref 0.7–4.0)
MCH: 27.7 pg (ref 26.0–34.0)
MCHC: 31.4 g/dL (ref 30.0–36.0)
MCV: 88.3 fL (ref 78.0–100.0)
Monocytes Absolute: 0.4 10*3/uL (ref 0.1–1.0)
Monocytes Relative: 8 % (ref 3–12)
Neutro Abs: 3.2 10*3/uL (ref 1.7–7.7)
Neutrophils Relative %: 66 % (ref 43–77)
Platelets: 149 10*3/uL — ABNORMAL LOW (ref 150–400)
RBC: 4.19 MIL/uL (ref 3.87–5.11)
RDW: 15.9 % — ABNORMAL HIGH (ref 11.5–15.5)
WBC: 4.8 10*3/uL (ref 4.0–10.5)

## 2013-06-01 LAB — BASIC METABOLIC PANEL
BUN: 27 mg/dL — ABNORMAL HIGH (ref 6–23)
CO2: 34 mEq/L — ABNORMAL HIGH (ref 19–32)
Calcium: 9.1 mg/dL (ref 8.4–10.5)
Chloride: 103 mEq/L (ref 96–112)
Creatinine, Ser: 0.85 mg/dL (ref 0.50–1.10)
GFR calc Af Amer: 73 mL/min — ABNORMAL LOW (ref >90)
GFR calc non Af Amer: 63 mL/min — ABNORMAL LOW (ref >90)
Glucose, Bld: 116 mg/dL — ABNORMAL HIGH (ref 70–99)
Potassium: 3.6 mEq/L — ABNORMAL LOW (ref 3.7–5.3)
Sodium: 146 mEq/L (ref 137–147)

## 2013-06-01 NOTE — ED Provider Notes (Signed)
CSN: [id number]     Arrival date & time 06/01/13  1148 History   First MD Initiated Contact with Patient 06/01/13 1153     Chief Complaint  Patient presents with  . Weakness  . Shortness of Breath     (Consider location/radiation/quality/duration/timing/severity/associated sxs/prior Treatment) HPI Pt is an 80yo female brought to ED from home, where she lives with her son, via EMS c/o generalized weakness and SOB x1 week with congestion x3 weeks. Pt denies chest pain, n/v/d or fever. Pt's friend reports she was started on antibiotics last week for pneumonia and still taking medication as prescribed. Denies sick contacts or recent travel. Denies abdominal pain or urinary symptoms.   Past Medical History  Diagnosis Date  . Stroke   . GERD (gastroesophageal reflux disease)   . Arthritis   . Cancer    Past Surgical History  Procedure Laterality Date  . Abdominal hysterectomy    . Peg placement  02/06/2012    Procedure: PERCUTANEOUS ENDOSCOPIC GASTROSTOMY (PEG) REPLACEMENT;  Surgeon: Jeryl Columbia, MD;  Location: [facility];  Service: Endoscopy;  Laterality: N/A;  needs F 18 repl;acement gastrostomy tube   No family history on file. History  Substance Use Topics  . Smoking status: Not on file  . Smokeless tobacco: Not on file  . Alcohol Use: No   OB History   Grav Para Term Preterm Abortions TAB SAB Ect Mult Living                 Review of Systems  Constitutional: Negative for fever and chills.  HENT: Positive for congestion.   Respiratory: Positive for cough and shortness of breath.   Cardiovascular: Negative for chest pain and palpitations.  Gastrointestinal: Negative for nausea, vomiting and abdominal pain.  All other systems reviewed and are negative.     Allergies  Review of patient's allergies indicates no known allergies.  Home Medications   Prior to Admission medications   Medication Sig Start Date End Date Taking? Authorizing Provider  diazepam (VALIUM) 5 MG  tablet Take 5 mg by mouth 2 (two) times daily. 1/2 tab    Historical Provider, MD  diphenhydrAMINE (BENADRYL) 25 mg capsule Take 25 mg by mouth 2 (two) times daily.    Historical Provider, MD  imipramine (TOFRANIL) 10 MG tablet Take 25 mg by mouth at bedtime. 100 mg    Historical Provider, MD  ranitidine (ZANTAC) 75 MG tablet Take 75 mg by mouth 2 (two) times daily.    Historical Provider, MD   BP 142/64  Pulse 90  Temp(Src) 98.9 F (37.2 C) (Oral)  Resp 20  SpO2 97% Physical Exam  Nursing note and vitals reviewed. Constitutional: She appears well-developed and well-nourished.  Thin elderly female, appears stated age, NAD.  HENT:  Head: Normocephalic and atraumatic.  Eyes: Conjunctivae are normal. No scleral icterus.  Neck: Normal range of motion.  Cardiovascular: Normal rate, regular rhythm and normal heart sounds.   Pulmonary/Chest: Effort normal and breath sounds normal. No respiratory distress. She has no wheezes. She has no rales. She exhibits no tenderness.  No respiratory distress, no accessory muscle use. Lungs: CTAB.  Abdominal: Soft. Bowel sounds are normal. She exhibits no distension and no mass. There is no tenderness. There is no rebound and no guarding.  Musculoskeletal: Normal range of motion.  Neurological: She is alert.  Right sided paralysis from older stroke.  Skin: Skin is warm and dry.    ED Course  Procedures (including critical care time)  Labs Review Labs Reviewed  CBC WITH DIFFERENTIAL - Abnormal; Notable for the following:    Hemoglobin 11.6 (*)    RDW 15.9 (*)    Platelets 149 (*)    All other components within normal limits  BASIC METABOLIC PANEL - Abnormal; Notable for the following:    Potassium 3.6 (*)    CO2 34 (*)    Glucose, Bld 116 (*)    BUN 27 (*)    GFR calc non Af Amer 63 (*)    GFR calc Af Amer 73 (*)    All other components within normal limits    Imaging Review Dg Chest 2 View  06/01/2013   CLINICAL DATA:  Shortness of breath   EXAM: CHEST  2 VIEW  COMPARISON:  05/26/2013  FINDINGS: Cardiac shadow is stable. Persistent left basilar it atelectasis with effusion is noted. The right lung remains clear. No bony abnormality is noted.  IMPRESSION: Persistent left basilar changes.   Electronically Signed   By: Inez Catalina M.D.   On: 06/01/2013 13:28     EKG Interpretation   Date/Time:  Monday Jun 01 2013 12:13:28 EDT Ventricular Rate:  86 PR Interval:  140 QRS Duration: 107 QT Interval:  392 QTC Calculation: 469 R Axis:   37 Text Interpretation:  Sinus rhythm Baseline wander in lead(s) V2 V4 No  significant change since last tracing Confirmed by POLLINA  MD,  CHRISTOPHER [phone]) on 06/01/2013 2:56:52 PM      MDM   Final diagnoses:  Generalized weakness  Shortness of breath  Nasal congestion    Pt c/o generalized weakness, SOB and nasal congestion. Currently taking doxycyline as for pneumonia dx by her PCP last week.  On exam, pt appears stated age, non-toxic.  Vitals: WNL, afebrile, 97% on RA.    Discussed pt with Dr. Betsey Holiday who also examined pt who reported to Dr. Betsey Holiday that she wanted to go home, stated she thought it was just her sinuses.  Will discharge pt home, advised to continued taking antibiotics and medications prescribed by her PCP last week. Advised to f/u with her PCP later this week for recheck of her symptoms. Return precautions provided. Pt verbalized understanding and agreement with tx plan.     Noland Fordyce, PA-C 06/01/13 1506

## 2013-06-01 NOTE — ED Provider Notes (Signed)
Medical screening examination/treatment/procedure(s) were conducted as a shared visit with non-physician practitioner(s) and myself.  I personally evaluated the patient during the encounter.   EKG Interpretation   Date/Time:  Monday Jun 01 2013 12:13:28 EDT Ventricular Rate:  86 PR Interval:  140 QRS Duration: 107 QT Interval:  392 QTC Calculation: 469 R Axis:   37 Text Interpretation:  Sinus rhythm Baseline wander in lead(s) V2 V4 No  significant change since last tracing Confirmed by POLLINA  MD,  CHRISTOPHER [phone]) on 06/01/2013 2:56:52 PM      Patient presents to the ER for evaluation of intermittent episodes of shortness of breath. She reports nasal congestion and postnasal drip. She is in no distress here in the ER. She was already on doxycycline for the symptoms. Chest x-ray was clear. Vital signs are normal. She is appropriate for outpatient management.  Orpah Greek, MD 06/01/13 1520

## 2013-06-01 NOTE — Discharge Instructions (Signed)
Take all your medication as prescribed including any antibiotics prescribed by your primary care provider last week.  Follow up with your primary care provider for ongoing healthcare needs. Return to ER for worsening or NEW symptoms including chest pain or difficulty breathing.

## 2013-06-01 NOTE — ED Notes (Signed)
Bed: WA07 Expected date:  Expected time:  Means of arrival:  Comments: EMS- 80 yr old F, SOB

## 2013-06-01 NOTE — ED Notes (Signed)
Per [facility]- Pt reports generalized weakness with shortness of breath x 1 week. Congestion x 3 weeks. Denies CP, N/V/D and fever. Right sided flaccid from old stroke. Slurred speech residual from past stroke.

## 2013-06-15 ENCOUNTER — Other Ambulatory Visit: Payer: Self-pay | Admitting: Gastroenterology

## 2013-06-15 NOTE — Addendum Note (Signed)
Addended by: Edilberto Roosevelt on: 06/15/2013 01:06 PM   Modules accepted: Orders  

## 2013-06-16 ENCOUNTER — Encounter (HOSPITAL_COMMUNITY): Payer: Self-pay | Admitting: *Deleted

## 2013-06-16 ENCOUNTER — Encounter (HOSPITAL_COMMUNITY)
Admission: RE | Disposition: A | Discharge status: Home / Self Care | Payer: Medicare Other | Source: Ambulatory Visit | Attending: Gastroenterology

## 2013-06-16 ENCOUNTER — Ambulatory Visit (HOSPITAL_COMMUNITY)
Admission: RE | Admit: 2013-06-16 | Discharge: 2013-06-16 | Disposition: A | Discharge status: Home / Self Care | Payer: Medicare Other | Source: Ambulatory Visit | Attending: Gastroenterology | Admitting: Gastroenterology

## 2013-06-16 DIAGNOSIS — Z931 Gastrostomy status: Secondary | ICD-10-CM | POA: Insufficient documentation

## 2013-06-16 HISTORY — PX: PEG PLACEMENT: SHX5437

## 2013-06-16 HISTORY — PX: BALLOON DILATION: SHX5330

## 2013-06-16 SURGERY — REPLACEMENT, PEG TUBE, WITHOUT ENDOSCOPY

## 2013-06-16 MED ORDER — SODIUM CHLORIDE 0.9 % IV SOLN: INTRAVENOUS | Status: DC

## 2013-06-16 NOTE — Progress Notes (Signed)
Patient tolerated peg stoma dilation and peg change well,peg tube marked at 6.5

## 2013-06-16 NOTE — Progress Notes (Signed)
PROCEDURE NOTE  06/16/2013  9:18 AM  PATIENT:  [name]  80 y.o. female  PRE-OPERATIVE DIAGNOSIS:  Feeding problem  POST-OPERATIVE DIAGNOSIS: Same  PROCEDURE: Change with balloon dilation using the 5.5 cm balloon inflated to 7 and 8 mm and 22 French 6 cc balloon PEG placement  SURGEON:  Surgeon(s): Jeryl Columbia, MD  ASSESSMENT/FINDINGS:  Change as above without problem or complication or any discomfort or bleeding  PLAN OF CARE: Customary post-PEG change followup when necessary and call when necessary

## 2013-06-16 NOTE — Discharge Instructions (Signed)
Call if question or problem otherwise resume previous PEG care and medical care

## 2013-06-17 ENCOUNTER — Encounter (HOSPITAL_COMMUNITY): Payer: Self-pay | Admitting: Gastroenterology
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
## 2014-03-12 DIAGNOSIS — R1310 Dysphagia, unspecified: Secondary | ICD-10-CM | POA: Diagnosis not present

## 2014-03-12 DIAGNOSIS — I6789 Other cerebrovascular disease: Secondary | ICD-10-CM | POA: Diagnosis not present

## 2014-04-13 DIAGNOSIS — N390 Urinary tract infection, site not specified: Secondary | ICD-10-CM | POA: Diagnosis not present

## 2014-04-13 DIAGNOSIS — R358 Other polyuria: Secondary | ICD-10-CM | POA: Diagnosis not present

## 2014-04-27 DIAGNOSIS — R350 Frequency of micturition: Secondary | ICD-10-CM | POA: Diagnosis not present

## 2014-05-05 DIAGNOSIS — R1310 Dysphagia, unspecified: Secondary | ICD-10-CM | POA: Diagnosis not present

## 2014-05-05 DIAGNOSIS — I6789 Other cerebrovascular disease: Secondary | ICD-10-CM | POA: Diagnosis not present

## 2014-05-07 ENCOUNTER — Other Ambulatory Visit: Payer: Self-pay | Admitting: Gastroenterology

## 2014-05-07 NOTE — Addendum Note (Signed)
Addended byClarene Essex on: 05/07/2014 03:26 PM   Modules accepted: Orders

## 2014-05-12 ENCOUNTER — Ambulatory Visit (HOSPITAL_COMMUNITY)
Admission: RE | Admit: 2014-05-12 | Discharge: 2014-05-12 | Disposition: A | Discharge status: Home / Self Care | Payer: Medicare Other | Source: Ambulatory Visit | Attending: Gastroenterology | Admitting: Gastroenterology

## 2014-05-12 ENCOUNTER — Encounter (HOSPITAL_COMMUNITY)
Admission: RE | Disposition: A | Discharge status: Home / Self Care | Payer: Self-pay | Source: Ambulatory Visit | Attending: Gastroenterology

## 2014-05-12 ENCOUNTER — Encounter (HOSPITAL_COMMUNITY): Payer: Self-pay | Admitting: Gastroenterology

## 2014-05-12 ENCOUNTER — Encounter (HOSPITAL_COMMUNITY)
Admission: RE | Disposition: A | Discharge status: Home / Self Care | Payer: Medicare Other | Source: Ambulatory Visit | Attending: Gastroenterology

## 2014-05-12 DIAGNOSIS — R1310 Dysphagia, unspecified: Secondary | ICD-10-CM | POA: Diagnosis not present

## 2014-05-12 DIAGNOSIS — R633 Feeding difficulties: Secondary | ICD-10-CM | POA: Diagnosis not present

## 2014-05-12 DIAGNOSIS — Z431 Encounter for attention to gastrostomy: Secondary | ICD-10-CM | POA: Insufficient documentation

## 2014-05-12 HISTORY — PX: PEG PLACEMENT: SHX5437

## 2014-05-12 SURGERY — REPLACEMENT, PEG TUBE, WITHOUT ENDOSCOPY

## 2014-05-12 SURGICAL SUPPLY — 1 items: Right Angle Peg ×3 IMPLANT

## 2014-05-12 NOTE — Progress Notes (Signed)
PROCEDURE NOTE  05/12/2014  9:19 AM  PATIENT:  [name]  81 y.o. female  PRE-OPERATIVE DIAGNOSIS:  Feeding problem  POST-OPERATIVE DIAGNOSIS: Same  PROCEDURE: The family request PEG tube change and upgrading size due to it falling out a few times at home however on further discussion the son was not filling the balloon with enough cc of water which we discussed SURGEON:  Surgeon(s): Clarene Essex, MD  ASSESSMENT/FINDINGS:  Her 37 French PEG was removed by withdrawing the water and gently removing the PEG after the abdomen was sterilely dressed and draped and her site looked good and was replaced with a 24 [employer] 6 mL balloon PEG in the customary fashion without complication or problem and the site was dressed  PLAN OF CARE: Okay to use PEG and call me when necessary and follow-up when necessary

## 2014-05-13 ENCOUNTER — Encounter (HOSPITAL_COMMUNITY): Payer: Self-pay | Admitting: Gastroenterology

## 2014-05-20 DIAGNOSIS — Z79899 Other long term (current) drug therapy: Secondary | ICD-10-CM | POA: Diagnosis not present

## 2014-05-20 DIAGNOSIS — Z23 Encounter for immunization: Secondary | ICD-10-CM | POA: Diagnosis not present

## 2014-05-20 DIAGNOSIS — R350 Frequency of micturition: Secondary | ICD-10-CM | POA: Diagnosis not present

## 2014-05-20 DIAGNOSIS — Z0000 Encounter for general adult medical examination without abnormal findings: Secondary | ICD-10-CM | POA: Diagnosis not present

## 2014-06-16 DIAGNOSIS — Z0000 Encounter for general adult medical examination without abnormal findings: Secondary | ICD-10-CM | POA: Diagnosis not present

## 2014-06-16 DIAGNOSIS — N390 Urinary tract infection, site not specified: Secondary | ICD-10-CM | POA: Diagnosis not present

## 2014-06-25 DIAGNOSIS — I6789 Other cerebrovascular disease: Secondary | ICD-10-CM | POA: Diagnosis not present

## 2014-06-25 DIAGNOSIS — R1310 Dysphagia, unspecified: Secondary | ICD-10-CM | POA: Diagnosis not present

## 2014-08-23 DIAGNOSIS — R1310 Dysphagia, unspecified: Secondary | ICD-10-CM | POA: Diagnosis not present

## 2014-08-23 DIAGNOSIS — I6789 Other cerebrovascular disease: Secondary | ICD-10-CM | POA: Diagnosis not present

## 2014-10-14 DIAGNOSIS — M25539 Pain in unspecified wrist: Secondary | ICD-10-CM | POA: Diagnosis not present

## 2014-10-21 DIAGNOSIS — R1310 Dysphagia, unspecified: Secondary | ICD-10-CM | POA: Diagnosis not present

## 2014-10-21 DIAGNOSIS — I6789 Other cerebrovascular disease: Secondary | ICD-10-CM | POA: Diagnosis not present

## 2014-12-14 DIAGNOSIS — I6789 Other cerebrovascular disease: Secondary | ICD-10-CM | POA: Diagnosis not present

## 2014-12-14 DIAGNOSIS — R1310 Dysphagia, unspecified: Secondary | ICD-10-CM | POA: Diagnosis not present

## 2016-02-20 ENCOUNTER — Other Ambulatory Visit (HOSPITAL_COMMUNITY): Payer: Self-pay | Admitting: Gastroenterology

## 2016-02-20 DIAGNOSIS — K9423 Gastrostomy malfunction: Secondary | ICD-10-CM

## 2016-02-21 ENCOUNTER — Ambulatory Visit (HOSPITAL_COMMUNITY)
Admission: RE | Admit: 2016-02-21 | Discharge: 2016-02-21 | Disposition: A | Discharge status: Home / Self Care | Payer: Medicare Other | Source: Ambulatory Visit | Attending: Gastroenterology | Admitting: Gastroenterology

## 2016-02-21 ENCOUNTER — Encounter (HOSPITAL_COMMUNITY): Payer: Self-pay | Admitting: Interventional Radiology

## 2016-02-21 DIAGNOSIS — Z431 Encounter for attention to gastrostomy: Secondary | ICD-10-CM | POA: Diagnosis present

## 2016-02-21 DIAGNOSIS — K9423 Gastrostomy malfunction: Secondary | ICD-10-CM

## 2016-02-21 HISTORY — PX: IR GENERIC HISTORICAL: IMG1180011

## 2016-02-21 MED ORDER — LIDOCAINE VISCOUS 2 % MT SOLN
OROMUCOSAL | Status: AC
  Administered 2016-02-21: 3 mL
  Filled 2016-02-21: qty 15

## 2016-02-21 MED ORDER — IOPAMIDOL (ISOVUE-300) INJECTION 61%
INTRAVENOUS | Status: AC
  Administered 2016-02-21: 10 mL
  Filled 2016-02-21: qty 50

## 2016-02-21 NOTE — Procedures (Signed)
Successful 18FR gtube REPLACEMENT  No comp Stable Ready for use

## 2017-03-18 ENCOUNTER — Other Ambulatory Visit: Payer: Self-pay | Admitting: Gastroenterology

## 2017-03-19 ENCOUNTER — Encounter (HOSPITAL_COMMUNITY)
Admission: RE | Disposition: A | Discharge status: Home / Self Care | Payer: Self-pay | Source: Ambulatory Visit | Attending: Gastroenterology

## 2017-03-19 ENCOUNTER — Ambulatory Visit (HOSPITAL_COMMUNITY)
Admission: RE | Admit: 2017-03-19 | Discharge: 2017-03-19 | Disposition: A | Discharge status: Home / Self Care | Payer: Medicare Other | Source: Ambulatory Visit | Attending: Gastroenterology | Admitting: Gastroenterology

## 2017-03-19 DIAGNOSIS — K9423 Gastrostomy malfunction: Secondary | ICD-10-CM | POA: Diagnosis not present

## 2017-03-19 HISTORY — PX: PEG PLACEMENT: SHX5437

## 2017-03-19 SURGERY — REPLACEMENT, PEG TUBE, WITHOUT ENDOSCOPY
Anesthesia: Topical

## 2017-03-19 NOTE — Op Note (Signed)
[facility] Patient Name: [name] Procedure Date: 03/19/2017 MRN: [id number] Attending MD: Clarene Essex , MD Date of Birth: 07-07-1930 CSN: [id number] Age: 84 Admit Type: Outpatient Procedure:                Non-endoscopic Tube Procedure Indications:              Exchange PEG tube due to clogged gastrostomy tube,                            Exchange PEG tube due to malfunctioning gastrostomy                            tube Providers:                Clarene Essex, MD Referring MD:              Medicines:                None Complications:            No immediate complications. Estimated Blood Loss:     Estimated blood loss: none. Procedure:                After obtaining informed consent, the site was                            prepped and the procedure was performed. The                            procedure was accomplished without difficulty. The                            patient tolerated the procedure well. Findings:      Upon external examination, normal-appearing skin was found surrounding       the stomal opening. The gastrostomy tube was broken, clogged and       malfunctioning. The existing gastrostomy site was examined and cleaned.       The water was removed in the customary fashion and the previously use       gastrostomy tube was removed and A 20 Fr EndoVive low-profile tube was       lubricated and placed into the existing gastrostomy port. A total of 6       mL saline was used to distend the balloon that was previously tested.       When positioned, the skin marking was noted to be 1-2 cm at the external       bumper. The final tension and compression of the abdominal wall by the       gastrostomy tube and external bumper were checked and revealed that the       bumper was loose and not touching the skin. Placement into the stomach       was confirmed by easily flushing the tube. The tube was capped, and the       tube site was cleaned and  dressed. Impression:               - The previously removed gastrostomy tube was  replaced with a 20 Fr EndoVive low-profile tube                           - No specimens collected.                           - The gastrostomy tube was removed and replaced                            with a gastrostomy tube. Recommendation:           - Patient has a contact number available for                            emergencies. The signs and symptoms of potential                            delayed complications were discussed with the                            patient. Return to normal activities tomorrow.                            Written discharge instructions were provided to the                            patient.                           - Resume previous diet today.                           - Continue present medications.                           - Return to GI clinic PRN.                           - Telephone GI clinic if symptomatic PRN. Procedure Code(s):        --- Professional ---                           [phone], Change of gastrostomy tube, percutaneous,                            without imaging or endoscopic guidance Diagnosis Code(s):        --- Professional ---                           Z43.1, Encounter for attention to gastrostomy                           K94.23, Gastrostomy malfunction CPT copyright 2016 [employer]. All rights reserved. The codes documented in this report are preliminary and upon coder review may  be revised to meet current compliance requirements. Clarene Essex, MD 03/19/2017 11:50:05 AM This report has been signed  electronically. Number of Addenda: 0

## 2017-03-19 NOTE — Progress Notes (Signed)
Dr. Watt Climes at bedside with patient replacing her PEG. Patient's son was present as well. No concerns or issues. Patient discharged home with son.

## 2017-03-19 NOTE — Progress Notes (Signed)
Patient was seen and examined and the Endo unit and her PEG was changed to a 20 [place] 6 mL balloon [employer] replacement PEG in the customary fashion without obvious change in condition problem etc. and the case discussed with her son as well

## 2017-03-19 NOTE — Discharge Instructions (Signed)
May resume gastrostomy tube use and resume previous diet and medicines and call us if any question or problem

## 2017-03-21 ENCOUNTER — Encounter (HOSPITAL_COMMUNITY): Payer: Self-pay | Admitting: Gastroenterology

## 2017-12-01 ENCOUNTER — Encounter (HOSPITAL_COMMUNITY): Payer: Self-pay

## 2017-12-01 ENCOUNTER — Emergency Department (HOSPITAL_COMMUNITY): Payer: Medicare Other

## 2017-12-01 ENCOUNTER — Inpatient Hospital Stay (HOSPITAL_COMMUNITY)
Admission: EM | Admit: 2017-12-01 | Discharge: 2017-12-29 | DRG: 871 | Disposition: E | Payer: Medicare Other | Attending: Pulmonary Disease | Admitting: Pulmonary Disease

## 2017-12-01 DIAGNOSIS — R0902 Hypoxemia: Secondary | ICD-10-CM

## 2017-12-01 DIAGNOSIS — E874 Mixed disorder of acid-base balance: Secondary | ICD-10-CM | POA: Diagnosis not present

## 2017-12-01 DIAGNOSIS — R739 Hyperglycemia, unspecified: Secondary | ICD-10-CM | POA: Diagnosis not present

## 2017-12-01 DIAGNOSIS — J189 Pneumonia, unspecified organism: Secondary | ICD-10-CM | POA: Diagnosis not present

## 2017-12-01 DIAGNOSIS — J80 Acute respiratory distress syndrome: Secondary | ICD-10-CM | POA: Diagnosis present

## 2017-12-01 DIAGNOSIS — R4182 Altered mental status, unspecified: Secondary | ICD-10-CM

## 2017-12-01 DIAGNOSIS — I69354 Hemiplegia and hemiparesis following cerebral infarction affecting left non-dominant side: Secondary | ICD-10-CM

## 2017-12-01 DIAGNOSIS — Z01818 Encounter for other preprocedural examination: Secondary | ICD-10-CM

## 2017-12-01 DIAGNOSIS — Z515 Encounter for palliative care: Secondary | ICD-10-CM | POA: Diagnosis not present

## 2017-12-01 DIAGNOSIS — R6521 Severe sepsis with septic shock: Secondary | ICD-10-CM

## 2017-12-01 DIAGNOSIS — E8770 Fluid overload, unspecified: Secondary | ICD-10-CM | POA: Diagnosis not present

## 2017-12-01 DIAGNOSIS — A419 Sepsis, unspecified organism: Secondary | ICD-10-CM | POA: Diagnosis present

## 2017-12-01 DIAGNOSIS — J9601 Acute respiratory failure with hypoxia: Secondary | ICD-10-CM | POA: Diagnosis not present

## 2017-12-01 DIAGNOSIS — T380X5A Adverse effect of glucocorticoids and synthetic analogues, initial encounter: Secondary | ICD-10-CM | POA: Diagnosis not present

## 2017-12-01 DIAGNOSIS — J9600 Acute respiratory failure, unspecified whether with hypoxia or hypercapnia: Secondary | ICD-10-CM

## 2017-12-01 DIAGNOSIS — E870 Hyperosmolality and hypernatremia: Secondary | ICD-10-CM | POA: Diagnosis not present

## 2017-12-01 DIAGNOSIS — Z931 Gastrostomy status: Secondary | ICD-10-CM

## 2017-12-01 DIAGNOSIS — I5030 Unspecified diastolic (congestive) heart failure: Secondary | ICD-10-CM | POA: Diagnosis not present

## 2017-12-01 DIAGNOSIS — Z66 Do not resuscitate: Secondary | ICD-10-CM | POA: Diagnosis not present

## 2017-12-01 DIAGNOSIS — J441 Chronic obstructive pulmonary disease with (acute) exacerbation: Secondary | ICD-10-CM | POA: Diagnosis not present

## 2017-12-01 DIAGNOSIS — J181 Lobar pneumonia, unspecified organism: Secondary | ICD-10-CM

## 2017-12-01 DIAGNOSIS — E876 Hypokalemia: Secondary | ICD-10-CM | POA: Diagnosis not present

## 2017-12-01 DIAGNOSIS — E861 Hypovolemia: Secondary | ICD-10-CM | POA: Diagnosis not present

## 2017-12-01 DIAGNOSIS — G9340 Encephalopathy, unspecified: Secondary | ICD-10-CM

## 2017-12-01 DIAGNOSIS — J690 Pneumonitis due to inhalation of food and vomit: Secondary | ICD-10-CM | POA: Diagnosis present

## 2017-12-01 DIAGNOSIS — R402443 Other coma, without documented Glasgow coma scale score, or with partial score reported, at hospital admission: Secondary | ICD-10-CM | POA: Diagnosis not present

## 2017-12-01 DIAGNOSIS — R6520 Severe sepsis without septic shock: Secondary | ICD-10-CM

## 2017-12-01 HISTORY — DX: Pneumonia, unspecified organism: J18.9

## 2017-12-01 LAB — TROPONIN I: TROPONIN I: 0.16 ng/mL — AB (ref <0.03)

## 2017-12-01 LAB — COMPREHENSIVE METABOLIC PANEL
ALT: 21 U/L (ref 0–44)
AST: 33 U/L (ref 15–41)
Albumin: 2.9 g/dL — ABNORMAL LOW (ref 3.5–5.0)
Alkaline Phosphatase: 72 U/L (ref 38–126)
Anion gap: 12 (ref 5–15)
BUN: 21 mg/dL (ref 8–23)
CO2: 27 mmol/L (ref 22–32)
CREATININE: 1.17 mg/dL — AB (ref 0.44–1.00)
Calcium: 8.8 mg/dL — ABNORMAL LOW (ref 8.9–10.3)
Chloride: 98 mmol/L (ref 98–111)
GFR calc Af Amer: 48 mL/min — ABNORMAL LOW (ref >60)
GFR, EST NON AFRICAN AMERICAN: 41 mL/min — AB (ref >60)
GLUCOSE: 112 mg/dL — AB (ref 70–99)
Potassium: 3.8 mmol/L (ref 3.5–5.1)
SODIUM: 137 mmol/L (ref 135–145)
Total Bilirubin: 0.6 mg/dL (ref 0.3–1.2)
Total Protein: 6.5 g/dL (ref 6.5–8.1)

## 2017-12-01 LAB — URINALYSIS, ROUTINE W REFLEX MICROSCOPIC
BILIRUBIN URINE: NEGATIVE
Glucose, UA: NEGATIVE mg/dL
HGB URINE DIPSTICK: NEGATIVE
KETONES UR: NEGATIVE mg/dL
Leukocytes, UA: NEGATIVE
NITRITE: POSITIVE — AB
PH: 7 (ref 5.0–8.0)
Protein, ur: NEGATIVE mg/dL
Specific Gravity, Urine: 1.014 (ref 1.005–1.030)

## 2017-12-01 LAB — CBC WITH DIFFERENTIAL/PLATELET
Abs Immature Granulocytes: 0.02 10*3/uL (ref 0.00–0.07)
BASOS ABS: 0 10*3/uL (ref 0.0–0.1)
BASOS PCT: 0 %
Eosinophils Absolute: 0 10*3/uL (ref 0.0–0.5)
Eosinophils Relative: 0 %
HEMATOCRIT: 38.4 % (ref 36.0–46.0)
Hemoglobin: 11.6 g/dL — ABNORMAL LOW (ref 12.0–15.0)
Immature Granulocytes: 0 %
Lymphocytes Relative: 8 %
Lymphs Abs: 0.4 10*3/uL — ABNORMAL LOW (ref 0.7–4.0)
MCH: 28 pg (ref 26.0–34.0)
MCHC: 30.2 g/dL (ref 30.0–36.0)
MCV: 92.8 fL (ref 80.0–100.0)
MONOS PCT: 8 %
Monocytes Absolute: 0.4 10*3/uL (ref 0.1–1.0)
NEUTROS ABS: 3.7 10*3/uL (ref 1.7–7.7)
NEUTROS PCT: 84 %
Platelets: 144 10*3/uL — ABNORMAL LOW (ref 150–400)
RBC: 4.14 MIL/uL (ref 3.87–5.11)
RDW: 14.1 % (ref 11.5–15.5)
WBC: 4.5 10*3/uL (ref 4.0–10.5)
nRBC: 0 % (ref 0.0–0.2)

## 2017-12-01 LAB — I-STAT CG4 LACTIC ACID, ED
Lactic Acid, Venous: 4.56 mmol/L (ref 0.5–1.9)
Lactic Acid, Venous: 3.35 mmol/L (ref 0.5–1.9)

## 2017-12-01 LAB — I-STAT TROPONIN, ED: Troponin i, poc: 0.11 ng/mL (ref 0.00–0.08)

## 2017-12-01 LAB — GLUCOSE, CAPILLARY: GLUCOSE-CAPILLARY: 101 mg/dL — AB (ref 70–99)

## 2017-12-01 LAB — BRAIN NATRIURETIC PEPTIDE: B NATRIURETIC PEPTIDE 5: 191 pg/mL — AB (ref 0.0–100.0)

## 2017-12-01 LAB — APTT: aPTT: 32 seconds (ref 24–36)

## 2017-12-01 LAB — I-STAT VENOUS BLOOD GAS, ED
Bicarbonate: 30.4 mmol/L — ABNORMAL HIGH (ref 20.0–28.0)
O2 Saturation: 71 %
PCO2 VEN: 81.6 mmHg — AB (ref 44.0–60.0)
PH VEN: 7.179 — AB (ref 7.250–7.430)
PO2 VEN: 48 mmHg — AB (ref 32.0–45.0)
TCO2: 33 mmol/L — AB (ref 22–32)

## 2017-12-01 LAB — PROTIME-INR
INR: 1.16
Prothrombin Time: 14.7 seconds (ref 11.4–15.2)

## 2017-12-01 LAB — I-STAT ARTERIAL BLOOD GAS, ED
Acid-base deficit: 1 mmol/L (ref 0.0–2.0)
BICARBONATE: 25.7 mmol/L (ref 20.0–28.0)
O2 Saturation: 100 %
PCO2 ART: 48.3 mmHg — AB (ref 32.0–48.0)
PH ART: 7.333 — AB (ref 7.350–7.450)
PO2 ART: 270 mmHg — AB (ref 83.0–108.0)
Patient temperature: 36.9
TCO2: 27 mmol/L (ref 22–32)

## 2017-12-01 LAB — STREP PNEUMONIAE URINARY ANTIGEN: STREP PNEUMO URINARY ANTIGEN: NEGATIVE

## 2017-12-01 LAB — MRSA PCR SCREENING: MRSA BY PCR: NEGATIVE

## 2017-12-01 MED ORDER — SUCCINYLCHOLINE CHLORIDE 20 MG/ML IJ SOLN
INTRAMUSCULAR | Status: AC | PRN
  Administered 2017-12-01: 100 mg via INTRAVENOUS

## 2017-12-01 MED ORDER — PIPERACILLIN-TAZOBACTAM 3.375 G IVPB
3.3750 g | Freq: Three times a day (TID) | INTRAVENOUS | Status: DC
  Administered 2017-12-02 – 2017-12-03 (×5): 3.375 g via INTRAVENOUS
  Filled 2017-12-01 (×7): qty 50

## 2017-12-01 MED ORDER — VANCOMYCIN HCL 10 G IV SOLR
1250.0000 mg | Freq: Once | INTRAVENOUS | Status: AC
  Administered 2017-12-01: 1250 mg via INTRAVENOUS
  Filled 2017-12-01 (×2): qty 1250

## 2017-12-01 MED ORDER — MIDAZOLAM HCL 2 MG/2ML IJ SOLN
1.0000 mg | Freq: Once | INTRAMUSCULAR | Status: AC
  Administered 2017-12-01: 1 mg via INTRAVENOUS
  Filled 2017-12-01: qty 2

## 2017-12-01 MED ORDER — FENTANYL CITRATE (PF) 100 MCG/2ML IJ SOLN
50.0000 ug | Freq: Once | INTRAMUSCULAR | Status: AC
  Administered 2017-12-01: 50 ug via INTRAVENOUS
  Filled 2017-12-01: qty 2

## 2017-12-01 MED ORDER — SODIUM CHLORIDE 0.9 % IV BOLUS
1000.0000 mL | Freq: Once | INTRAVENOUS | Status: AC
  Administered 2017-12-01: 1000 mL via INTRAVENOUS

## 2017-12-01 MED ORDER — HEPARIN SODIUM (PORCINE) 5000 UNIT/ML IJ SOLN
5000.0000 [IU] | Freq: Three times a day (TID) | INTRAMUSCULAR | Status: DC
  Administered 2017-12-01 – 2017-12-08 (×20): 5000 [IU] via SUBCUTANEOUS
  Filled 2017-12-01 (×20): qty 1

## 2017-12-01 MED ORDER — VANCOMYCIN HCL IN DEXTROSE 1-5 GM/200ML-% IV SOLN
1000.0000 mg | INTRAVENOUS | Status: DC
  Administered 2017-12-02: 1000 mg via INTRAVENOUS
  Filled 2017-12-01: qty 200

## 2017-12-01 MED ORDER — ETOMIDATE 2 MG/ML IV SOLN
INTRAVENOUS | Status: AC | PRN
  Administered 2017-12-01: 20 mg via INTRAVENOUS

## 2017-12-01 MED ORDER — SODIUM CHLORIDE 0.9 % IV SOLN
INTRAVENOUS | Status: DC
  Administered 2017-12-01 – 2017-12-07 (×11): via INTRAVENOUS

## 2017-12-01 MED ORDER — ORAL CARE MOUTH RINSE
15.0000 mL | OROMUCOSAL | Status: DC
  Administered 2017-12-01 – 2017-12-03 (×20): 15 mL via OROMUCOSAL

## 2017-12-01 MED ORDER — ASPIRIN 81 MG PO CHEW
324.0000 mg | CHEWABLE_TABLET | ORAL | Status: AC
  Administered 2017-12-01: 324 mg via ORAL
  Filled 2017-12-01: qty 4

## 2017-12-01 MED ORDER — DEXMEDETOMIDINE HCL IN NACL 400 MCG/100ML IV SOLN
0.4000 ug/kg/h | INTRAVENOUS | Status: DC
  Administered 2017-12-01: 0.4 ug/kg/h via INTRAVENOUS
  Administered 2017-12-02: 0.2 ug/kg/h via INTRAVENOUS
  Administered 2017-12-03: 0.8 ug/kg/h via INTRAVENOUS
  Filled 2017-12-01 (×4): qty 100

## 2017-12-01 MED ORDER — PIPERACILLIN-TAZOBACTAM 3.375 G IVPB 30 MIN
3.3750 g | Freq: Once | INTRAVENOUS | Status: AC
  Administered 2017-12-01: 3.375 g via INTRAVENOUS
  Filled 2017-12-01: qty 50

## 2017-12-01 MED ORDER — FAMOTIDINE IN NACL 20-0.9 MG/50ML-% IV SOLN
20.0000 mg | Freq: Every day | INTRAVENOUS | Status: DC
  Administered 2017-12-01 – 2017-12-07 (×7): 20 mg via INTRAVENOUS
  Filled 2017-12-01 (×8): qty 50

## 2017-12-01 MED ORDER — ASPIRIN 300 MG RE SUPP: 300.0000 mg | RECTAL | Status: AC

## 2017-12-01 MED ORDER — SODIUM CHLORIDE 0.9 % IV SOLN
500.0000 mg | INTRAVENOUS | Status: DC
  Administered 2017-12-01: 500 mg via INTRAVENOUS
  Filled 2017-12-01: qty 500

## 2017-12-01 MED ORDER — MIDAZOLAM HCL 2 MG/2ML IJ SOLN
2.0000 mg | Freq: Once | INTRAMUSCULAR | Status: AC
  Administered 2017-12-01: 2 mg via INTRAVENOUS
  Filled 2017-12-01: qty 2

## 2017-12-01 MED ORDER — CHLORHEXIDINE GLUCONATE 0.12% ORAL RINSE (MEDLINE KIT)
15.0000 mL | Freq: Two times a day (BID) | OROMUCOSAL | Status: DC
  Administered 2017-12-01 – 2017-12-03 (×4): 15 mL via OROMUCOSAL

## 2017-12-01 MED ORDER — NOREPINEPHRINE 4 MG/250ML-% IV SOLN
0.0000 ug/min | Freq: Once | INTRAVENOUS | Status: AC
  Administered 2017-12-01: 2 ug/min via INTRAVENOUS
  Filled 2017-12-01: qty 250

## 2017-12-01 MED ORDER — SODIUM CHLORIDE 0.9 % IV SOLN
2.0000 g | INTRAVENOUS | Status: DC
  Administered 2017-12-01: 2 g via INTRAVENOUS
  Filled 2017-12-01: qty 20

## 2017-12-01 MED ORDER — LACTATED RINGERS IV BOLUS
1000.0000 mL | Freq: Once | INTRAVENOUS | Status: AC
  Administered 2017-12-01: 1000 mL via INTRAVENOUS

## 2017-12-01 MED ORDER — LACTATED RINGERS IV BOLUS (SEPSIS)
1000.0000 mL | Freq: Once | INTRAVENOUS | Status: AC
  Administered 2017-12-01: 1000 mL via INTRAVENOUS

## 2017-12-01 NOTE — ED Provider Notes (Addendum)
[facility] EMERGENCY DEPARTMENT Provider Note   CSN: [id number] Arrival date & time: 12/23/2017  1123     History   Chief Complaint Chief Complaint  Patient presents with  . unresponsive    HPI Courtney CANNELLA is a 84 y.o. female.  Patient is an 84 year old female with limited medical history at this time presenting by paramedics with being unresponsive.  Per son who apparently lives with the patient she was normal last night around midnight and at this morning around 7:00 he found her on the floor.  At that time she was responsive but she was sluggish.  Approximately 1 hour later he went into check on her and she was not responding any longer.  EMS states she did had dry vomit on her clothing but unknown if she had been vomiting or had cough or other issues.  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  Nobody has noted any seizure activity.  It is unclear what kind of medications the patient takes.  The history is provided by the EMS personnel. The history is limited by the condition of the patient and the absence of a caregiver.    History reviewed. No pertinent past medical history.  There are no active problems to display for this patient.   History reviewed. No pertinent surgical history.   OB History   None      Home Medications    Prior to Admission medications   Not on File    Family History No family history on file.  Social History Social History   Tobacco Use  . Smoking status: Not on file  Substance Use Topics  . Alcohol use: Not on file  . Drug use: Not on file     Allergies   Patient has no known allergies.   Review of Systems Review of Systems  Unable to perform ROS: Acuity of condition     Physical Exam Updated Vital Signs BP 113/79   Pulse (!) 113   Temp (!) 96.6 F (35.9 C) (Rectal)   Resp (!) 30   Ht 5\' 3"  (1.6 m)    SpO2 100%   Physical Exam  Constitutional: She appears well-developed and well-nourished. She appears distressed.  HENT:  Head: Normocephalic and atraumatic.  Dry mucous membranes with purulent sputum sputum noted.  No signs of head trauma  Eyes: Pupils are equal, round, and reactive to light. Conjunctivae and EOM are normal.  Pupils are deviating to the right and approximately 2 to 3 mm sluggishly reactive  Neck: Normal range of motion. Neck supple.  Cardiovascular: Regular rhythm and intact distal pulses. Tachycardia present.  No murmur heard. Pulmonary/Chest: Accessory muscle usage present. Tachypnea noted. No respiratory distress. She has no wheezes. She has no rales.  Nasal trumpet in place and patient is breathing spontaneously.  Coarse breath sounds throughout  Abdominal: Soft. She exhibits no distension. There is no tenderness. There is no rebound and no guarding.  Musculoskeletal: Normal range of motion. She exhibits no edema or tenderness.  Neurological:  Patient will respond to pain and continues to pull at the blanket with her right hand.  She will not open her eyes on request and does not respond to voice.  Flaccid paralysis noted to the left upper and lower extremity  Skin: Skin is warm and dry. No rash noted. No erythema.  Psychiatric:  Minimally responsive  Nursing note and vitals reviewed.    ED Treatments / Results  Labs (all labs ordered are listed, but only abnormal results are displayed) Labs Reviewed  COMPREHENSIVE METABOLIC PANEL - Abnormal; Notable for the following components:      Result Value   Glucose, Bld 112 (*)    Creatinine, Ser 1.17 (*)    Calcium 8.8 (*)    Albumin 2.9 (*)    GFR calc non Af Amer 41 (*)    GFR calc Af Amer 48 (*)    All other components within normal limits  CBC WITH DIFFERENTIAL/PLATELET - Abnormal; Notable for the following components:   Hemoglobin 11.6 (*)    Platelets 144 (*)    Lymphs Abs 0.4 (*)    All other  components within normal limits  I-STAT CG4 LACTIC ACID, ED - Abnormal; Notable for the following components:   Lactic Acid, Venous 4.56 (*)    All other components within normal limits  I-STAT TROPONIN, ED - Abnormal; Notable for the following components:   Troponin i, poc 0.11 (*)    All other components within normal limits  I-STAT VENOUS BLOOD GAS, ED - Abnormal; Notable for the following components:   pH, Ven 7.179 (*)    pCO2, Ven 81.6 (*)    pO2, Ven 48.0 (*)    Bicarbonate 30.4 (*)    TCO2 33 (*)    All other components within normal limits  CULTURE, BLOOD (ROUTINE X 2)  CULTURE, BLOOD (ROUTINE X 2)  URINE CULTURE  PROTIME-INR  APTT  URINALYSIS, ROUTINE W REFLEX MICROSCOPIC  BRAIN NATRIURETIC PEPTIDE  I-STAT CG4 LACTIC ACID, ED  I-STAT CG4 LACTIC ACID, ED    EKG EKG Interpretation  Date/Time:  Sunday December 01 2017 11:50:49 EST Ventricular Rate:  109 PR Interval:    QRS Duration: 116 QT Interval:  374 QTC Calculation: 504 R Axis:   53 Text Interpretation:  Sinus tachycardia Nonspecific intraventricular conduction delay Borderline repolarization abnormality No previous tracing Confirmed by Blanchie Dessert [phone]) on 12/02/2017 12:24:13 PM   Radiology Ct Head Wo Contrast  Result Date: 12/21/2017 CLINICAL DATA:  Fall.  Decreased alertness. EXAM: CT HEAD WITHOUT CONTRAST TECHNIQUE: Contiguous axial images were obtained from the base of the skull through the vertex without intravenous contrast. COMPARISON:  None. FINDINGS: Brain: No evidence of acute infarction, hemorrhage, hydrocephalus, or extra-axial collection. Old large right MCA territory infarct involving the insula and basal ganglia with compensatory dilatation of the right lateral ventricle and Wallerian degeneration of the right cerebral peduncle. 8 mm dural-based, partially calcified lesion near the left sylvian fissure is likely a small meningioma. Mild to moderate generalized cerebral atrophy. Moderate  periventricular and subcortical white matter hypodensities are nonspecific, but favored to reflect chronic microvascular ischemic changes. Vascular: Calcified atherosclerosis at the skullbase. No hyperdense vessel. Skull: Negative for fracture or focal lesion. Sinuses/Orbits: Scattered mild paranasal sinus mucosal thickening. Likely chronic partial opacification of the right mastoid air cells status post canal wall down mastoidectomy. No acute orbital finding. Other: None. IMPRESSION: 1.  No acute intracranial abnormality. 2. Old large right MCA territory infarct. Electronically Signed   By: Titus Dubin M.D.   On: 12/16/2017 13:44   Dg Chest Port 1 View  Result Date: 12/27/2017 CLINICAL DATA:  Endotracheal tube placement. EXAM: PORTABLE CHEST 1 VIEW COMPARISON:  None. FINDINGS: Heart is enlarged. Endotracheal tube terminates 5 cm above the carina. NG tube courses off the inferior border the film. Moderate pulmonary vascular  congestion is present bilaterally. Ill-defined left lower lobe airspace disease is present. A left pleural effusion is suspected. IMPRESSION: 1. Satisfactory positioning of the endotracheal tube at the level of the clavicles. 2. Aortic atherosclerosis. 3. Cardiomegaly with mild interstitial edema. 4. Ill-defined left lower lobe airspace disease. While this may represent atelectasis, the finding is concerning for pneumonia. Electronically Signed   By: San Morelle M.D.   On: 12/11/2017 13:11    Procedures Procedure Name: Intubation Date/Time: 12/28/2017 1:49 PM Performed by: Blanchie Dessert, MD Pre-anesthesia Checklist: Patient identified, Patient being monitored, Emergency Drugs available, Timeout performed and Suction available Oxygen Delivery Method: Non-rebreather mask Preoxygenation: Pre-oxygenation with 100% oxygen Induction Type: Rapid sequence Ventilation: Mask ventilation without difficulty Laryngoscope Size: Glidescope and 4 Nasal Tubes: Left Tube size: 7.5  mm Number of attempts: 1 Airway Equipment and Method: Video-laryngoscopy Placement Confirmation: ETT inserted through vocal cords under direct vision,  CO2 detector and Breath sounds checked- equal and bilateral Secured at: 22 cm Tube secured with: ETT holder Dental Injury: Teeth and Oropharynx as per pre-operative assessment  Difficulty Due To: Difficulty was unanticipated Future Recommendations: Recommend- induction with short-acting agent, and alternative techniques readily available    .Central Line Date/Time: 12/20/2017 1:50 PM Performed by: Blanchie Dessert, MD Authorized by: Blanchie Dessert, MD   Consent:    Consent obtained:  Emergent situation   Alternatives discussed:  Delayed treatment Pre-procedure details:    Hand hygiene: Hand hygiene performed prior to insertion     Sterile barrier technique: All elements of maximal sterile technique followed     Skin preparation:  2% chlorhexidine   Skin preparation agent: Skin preparation agent completely dried prior to procedure   Anesthesia (see MAR for exact dosages):    Anesthesia method:  None Procedure details:    Location:  L femoral   Site selection rationale:  Pt in resp distress and no airway so quickest site   Patient position:  Flat   Procedural supplies:  Triple lumen   Landmarks identified: yes     Ultrasound guidance: yes     Sterile ultrasound techniques: Sterile gel and sterile probe covers were used     Number of attempts:  1   Successful placement: yes   Post-procedure details:    Post-procedure:  Dressing applied and line sutured   Assessment:  Blood return through all ports and free fluid flow   Patient tolerance of procedure:  Tolerated well, no immediate complications   (including critical care time)  Medications Ordered in ED Medications  sodium chloride 0.9 % bolus 1,000 mL (has no administration in time range)  0.9 %  sodium chloride infusion (has no administration in time range)  fentaNYL  (SUBLIMAZE) injection 50 mcg (has no administration in time range)  midazolam (VERSED) injection 2 mg (has no administration in time range)  etomidate (AMIDATE) injection (20 mg Intravenous Given 12/26/2017 1217)  etomidate (AMIDATE) injection (20 mg Intravenous Given 12/07/2017 1135)  succinylcholine (ANECTINE) injection (100 mg Intravenous Given 12/06/2017 1136)     Initial Impression / Assessment and Plan / ED Course  I have reviewed the triage vital signs and the nursing notes.  Pertinent labs & imaging results that were available during my care of the patient were reviewed by me and considered in my medical decision making (see chart for details).     Patient presenting with altered mental status.  Patient was last seen normal at midnight last night.  Patient was found on the floor today next to  her bed but son states she was responsive but not quite herself.  Then approximately 1 hour later per EMS report she was nonresponsive.  Upon arrival here patient is breathing spontaneously but will not follow commands.  She does react to pain.  Temperature is 96.6 rectally.  Breath sounds are coarse throughout with copious amounts of mucus in the airway.  Despite multiple attempts for peripheral IV they were on a successful and so a central line had to be placed so that patient could be intubated for airway protection.  Left femoral line was placed and patient was intubated without difficulty.  Patient remains hemodynamically stable with a blood pressure in the low 100s, heart rate is in the low 100s.  Oxygen saturation 100%.  Patient found to have a mild troponin leak of 0.11 as well as a lactic acid of 4.56 and a VBG with a respiratory acidosis with a CO2 of 81 and a pH of 7.17.  Concern for possible sepsis versus intracranial pathology.  Patient had a code sepsis called with blood cultures and IV fluids started.  Also head CT pending.  Chest x-ray confirmed good tube placement but signs of PNA.  1:47  PM Spoke with patient's son who states she had been coughing for the last few days but seemed fine at midnight and then became unresponsive this morning.  He does state that patient would want to be intubated to see if her situation was going to improve but would not want long-term support.  Patient had a code sepsis called and was started on antibiotics for pneumonia.  Patient received IV fluid boluses for her lactic acid.  That would be 2 L will be her 30/kg.  However patient became hypotensive with maps in the 50s with pressure of 70/40.  While IV fluids were going and Levophed was also started.  Patient's hemoglobin and renal function without acute findings.  Will admit to critical care Head CT without acute findings CRITICAL CARE Performed by: Manju Kulkarni Total critical care time: 45 minutes Critical care time was exclusive of separately billable procedures and treating other patients. Critical care was necessary to treat or prevent imminent or life-threatening deterioration. Critical care was time spent personally by me on the following activities: development of treatment plan with patient and/or surrogate as well as nursing, discussions with consultants, evaluation of patient's response to treatment, examination of patient, obtaining history from patient or surrogate, ordering and performing treatments and interventions, ordering and review of laboratory studies, ordering and review of radiographic studies, pulse oximetry and re-evaluation of patient's condition.  Final Clinical Impressions(s) / ED Diagnoses   Final diagnoses:  Altered mental status, unspecified altered mental status type  Community acquired pneumonia of left lower lobe of lung ([place])  Severe sepsis [facility])    ED Discharge Orders    None       Blanchie Dessert, MD 12/27/2017 1351    Blanchie Dessert, MD 12/25/2017 1351

## 2017-12-01 NOTE — Progress Notes (Signed)
Pharmacy Antibiotic Note  [name] is a 84 y.o. female admitted on 12/26/2017 with unwitnessed fall and then became unresponsive an hour later.  Pharmacy has been consulted for vancomycin and Zosyn dosing for sepsis and LLL PNA, concerning for aspiration.  SCr 1.17 (unsure of baseline), CrCL 32 ml/min, hypothermic, WBC WNL, LA down 3.35.  Plan: Vanc 1250mg  IV x 1, then 1gm IV Q24H Zosyn 3.375gm IV x 1 over 30 min, then EID Q8H Monitor renal fxn, clinical progress, vanc trough at Css   Height: 5\' 3"  (160 cm) Weight: 140 lb (63.5 kg) IBW/kg (Calculated) : 52.4  Temp (24hrs), Avg:96.4 F (35.8 C), Min:95.9 F (35.5 C), Max:97 F (36.1 C)  Recent Labs  Lab 12/11/2017 1216 12/04/2017 1233 12/17/2017 1435  WBC 4.5  --   --   CREATININE 1.17*  --   --   LATICACIDVEN  --  4.56* 3.35*    Estimated Creatinine Clearance: 31.5 mL/min (A) (by C-G formula based on SCr of 1.17 mg/dL (H)).    Allergies no known allergies   CTX 11/3 >> 11/3 Otho Bellows 11/3 >> 11/3 Vanc 11/3 >> Zosyn 11/3 >>  11/3 UCx -  11/3 BCx -    Remigio Eisenmenger D. Mina Marble, PharmD, BCPS, [place] 12/25/2017, 3:11 PM

## 2017-12-01 NOTE — ED Triage Notes (Signed)
Patient arrived by [facility] following unwitnessed fall by patients son. Patient per EMS was originally alert and talking post fall and then became unresponsive 1 hour post fall per son. EMS reports no response to NPA. CBG 170. No signs of trauma post fall, no bruising to face, no lacerations, dried vomit to clothing noted, rales bilateral on arrival. Patient does withdraw to pain

## 2017-12-01 NOTE — H&P (Signed)
NAME:  [name], MRN:  [id number], DOB:  04-16-1930, LOS: 0 ADMISSION DATE:  12/19/2017, CONSULTATION DATE:  12/19/2017 REFERRING MD:  EDP - [place], CHIEF COMPLAINT:  Respiratory failure and septic shock   Brief History   84 year old female with unknown PMH who presents to the hospital via EMS after being found barely responsive by son who returns to check on her an hour later and now she was completely unresponsive.  EMS was called and patient was brought to the ED where she was intubated due to inability to inability to protect her airway and was found to be hypotensive.  No family bedside and no history available.  Past Medical History  CVA with left sided paralysis  Significant Hospital Events   11/3 intubation for PNA  Consults: date of consult/date signed off & final recs:  PCCM  Procedures (surgical and bedside):  ETT 11/3>>> R femoral TLC 11/3>>> R radial a-line 11/3>>>  Significant Diagnostic Tests:  CT with old right sided CVA  Micro Data:  Blood 11/3>>> Resp 11/3>>> Urine 11/3>>>  Antimicrobials:  Vanc 11/3>>> Zosyn 11/3>>>   Subjective:  Sedated and paralyzed  Objective   Blood pressure (!) 100/53, pulse 100, temperature (!) 96.6 F (35.9 C), resp. rate 16, height 5\' 3"  (1.6 m), weight 63.5 kg, SpO2 100 %.    Vent Mode: PRVC FiO2 (%):  [100 %] 100 % Set Rate:  [16 bmp] 16 bmp Vt Set:  [410 mL] 410 mL PEEP:  [5 cmH20] 5 cmH20   Intake/Output Summary (Last 24 hours) at 12/08/2017 1442 Last data filed at 12/28/2017 1423 Gross per 24 hour  Intake 2100.95 ml  Output -  Net 2100.95 ml   Filed Weights   12/13/2017 1240  Weight: 63.5 kg    Examination: General: Chronically ill appearing female, NAD HENT: [place]/AT, no pupillary reflex, no EOM and MMM Lungs: Coarse BS diffusely Cardiovascular: RRR, Nl S1/S2 and -M/R/G Abdomen: Soft, NT, ND and +BS Extremities: -edema and -tenderness Neuro: Sedated and paralyzed Skin: thin but intact  Resolved  Hospital Problem list   N/A  I reviewed CXR myself, ETT is in a good position, LLL infiltrate  Assessment & Plan:  84 year old female with LLL PNA and septic shock, concern for aspiration, respiratory failure.  Discussed with PCCM-NP.  VDRF:  - ABG now  - Adjust vent for ABG  - ABG and CXR in AM  - Titrate O2 for sat of 8-92%  Septic shock:  - Levophed  - Septic shock protocol  - Treat infection  - IVF  PNA:  - Vanc  - Zosyn  - Pan cultures  - F/U on cultures  GOC: full code, no family bedside and no family picking up the phone  CVA: history  - Monitor clinically  Sedation:  - Fentanyl drip   - PRN versed  Disposition / Summary of Today's Plan 12/22/2017   See above  Labs   CBC: Recent Labs  Lab 11/30/2017 1216  WBC 4.5  NEUTROABS 3.7  HGB 11.6*  HCT 38.4  MCV 92.8  PLT 858*   Basic Metabolic Panel: Recent Labs  Lab 12/03/2017 1216  NA 137  K 3.8  CL 98  CO2 27  GLUCOSE 112*  BUN 21  CREATININE 1.17*  CALCIUM 8.8*   GFR: Estimated Creatinine Clearance: 31.5 mL/min (A) (by C-G formula based on SCr of 1.17 mg/dL (H)). Recent Labs  Lab 12/16/2017 1216 12/23/2017 1233  WBC 4.5  --  LATICACIDVEN  --  4.56*    Liver Function Tests: Recent Labs  Lab 12/15/2017 1216  AST 33  ALT 21  ALKPHOS 72  BILITOT 0.6  PROT 6.5  ALBUMIN 2.9*   No results for input(s): LIPASE, AMYLASE in the last 168 hours. No results for input(s): AMMONIA in the last 168 hours.  ABG    Component Value Date/Time   HCO3 30.4 (H) 12/03/2017 1233   TCO2 33 (H) 12/13/2017 1233   O2SAT 71.0 12/05/2017 1233     Coagulation Profile: Recent Labs  Lab 12/06/2017 1215  INR 1.16    Cardiac Enzymes: No results for input(s): CKTOTAL, CKMB, CKMBINDEX, TROPONINI in the last 168 hours.  HbA1C: No results found for: HGBA1C  CBG: No results for input(s): GLUCAP in the last 168 hours.  Admitting History of Present Illness.   84 year old female with unknown PMH who presents  to the hospital via EMS after being found barely responsive by son who returns to check on her an hour later and now she was completely unresponsive.  EMS was called and patient was brought to the ED where she was intubated due to inability to inability to protect her airway and was found to be hypotensive.  No family bedside and no history available.  Review of Systems:   Unattainable  Past Medical History  She,  has no past medical history on file.   Surgical History   History reviewed. No pertinent surgical history.   Social History   Social History   Socioeconomic History  . Marital status: Widowed    Spouse name: Not on file  . Number of children: Not on file  . Years of education: Not on file  . Highest education level: Not on file  Occupational History  . Not on file  Social Needs  . Financial resource strain: Not on file  . Food insecurity:    Worry: Not on file    Inability: Not on file  . Transportation needs:    Medical: Not on file    Non-medical: Not on file  Tobacco Use  . Smoking status: Not on file  Substance and Sexual Activity  . Alcohol use: Not on file  . Drug use: Not on file  . Sexual activity: Not on file  Lifestyle  . Physical activity:    Days per week: Not on file    Minutes per session: Not on file  . Stress: Not on file  Relationships  . Social connections:    Talks on phone: Not on file    Gets together: Not on file    Attends religious service: Not on file    Active member of club or organization: Not on file    Attends meetings of clubs or organizations: Not on file    Relationship status: Not on file  . Intimate partner violence:    Fear of current or ex partner: Not on file    Emotionally abused: Not on file    Physically abused: Not on file    Forced sexual activity: Not on file  Other Topics Concern  . Not on file  Social History Narrative  . Not on file  ,     Family History   Her family history is not on file.    Allergies Allergies no known allergies   Home Medications  Prior to Admission medications   Not on File    The patient is critically ill with multiple organ systems failure  and requires high complexity decision making for assessment and support, frequent evaluation and titration of therapies, application of advanced monitoring technologies and extensive interpretation of multiple databases.   Critical Care Time devoted to patient care services described in this note is  35  Minutes. This time reflects time of care of this signee Dr Jennet Maduro. This critical care time does not reflect procedure time, or teaching time or supervisory time of PA/NP/Med student/Med Resident etc but could involve care discussion time.  Rush Farmer, M.D. [facility] Pulmonary/Critical Care Medicine. Pager: [phone]. After hours pager: [phone].

## 2017-12-01 NOTE — ED Notes (Signed)
Pt placed on bair hugger

## 2017-12-01 NOTE — Progress Notes (Signed)
[place] Progress Note Patient Name: [name] DOB: 04-09-1930 MRN: [id number]   Date of Service  12/06/2017  HPI/Events of Note  Agitation/Delirium - Patient intubated and ventilated.  eICU Interventions  Will order: 1. Precedex IV infusion. Titrate to RASS = 0.     Intervention Category Major Interventions: Delirium, psychosis, severe agitation - evaluation and management  , [place] 12/27/2017, 7:54 PM

## 2017-12-01 NOTE — Progress Notes (Signed)
Pt transported to CT on vent, without complication  

## 2017-12-01 NOTE — ED Notes (Signed)
Attempted PIV x 3 unsuccessful by 2 different RNs, EMS IV flushing but patient not sedated for intubation

## 2017-12-01 NOTE — Progress Notes (Signed)
[place] Progress Note Patient Name: [name] DOB: 01-Apr-1930 MRN: [id number]   Date of Service  12/06/2017  HPI/Events of Note  Troponin = 0.16 - EKG with Sinus Tachycardia - HR = 109.Nonspecific intraventricular conduction delay and borderline repolarization changes. Demand ischemia. Patient is already on ASA.  eICU Interventions  Continue to trend Troponin.      Intervention Category Intermediate Interventions: Diagnostic test evaluation  Lysle Dingwall 12/16/2017, 7:46 PM

## 2017-12-01 NOTE — ED Notes (Signed)
Pt successfully intubated- 22 at the lip

## 2017-12-01 NOTE — Procedures (Signed)
Arterial Catheter Insertion Procedure Note PATRIECE ARCHBOLD [id number] March 02, 1930  Procedure: Insertion of Arterial Catheter  Indications: Blood pressure monitoring and Frequent blood sampling  Procedure Details Consent: Unable to obtain consent because of altered level of consciousness. Time Out: Verified patient identification, verified procedure, site/side was marked, verified correct patient position, special equipment/implants available, medications/allergies/relevent history reviewed, required imaging and test results available.  Performed  Maximum sterile technique was used including antiseptics, cap, gloves, gown, hand hygiene, mask and sheet. Skin prep: Chlorhexidine; local anesthetic administered 20 gauge catheter was inserted into right radial artery using the Seldinger technique. ULTRASOUND GUIDANCE USED: NO Evaluation Blood flow good; BP tracing good. Complications: No apparent complications.   Darlette Dubow 12/23/2017

## 2017-12-01 NOTE — Progress Notes (Signed)
Pt transported from the ED to 2M09 via vent with no complications noted.

## 2017-12-01 NOTE — Progress Notes (Signed)
Chaplain responded to Trauma B call following a co-occurring Code Blue on 6E.  Chaplain located son in waiting area, facilitated communication with medical team and offered hospitality and emotional support.  Please contact as needed for ongoing support.   Luana Shu [phone]    12/06/2017 1300  Clinical Encounter Type  Visited With Family  Visit Type Initial;Critical Care  Referral From Physician  Consult/Referral To Chaplain  Stress Factors  Family Stress Factors Lack of knowledge

## 2017-12-02 ENCOUNTER — Encounter (HOSPITAL_COMMUNITY): Payer: Self-pay | Admitting: *Deleted

## 2017-12-02 ENCOUNTER — Inpatient Hospital Stay (HOSPITAL_COMMUNITY): Payer: Medicare Other

## 2017-12-02 DIAGNOSIS — I5030 Unspecified diastolic (congestive) heart failure: Secondary | ICD-10-CM

## 2017-12-02 LAB — ECHOCARDIOGRAM COMPLETE
Height: 63 in
WEIGHTICAEL: 2483.26 [oz_av]

## 2017-12-02 LAB — CBC
HEMATOCRIT: 28.6 % — AB (ref 36.0–46.0)
Hemoglobin: 9 g/dL — ABNORMAL LOW (ref 12.0–15.0)
MCH: 28.7 pg (ref 26.0–34.0)
MCHC: 31.5 g/dL (ref 30.0–36.0)
MCV: 91.1 fL (ref 80.0–100.0)
Platelets: 114 10*3/uL — ABNORMAL LOW (ref 150–400)
RBC: 3.14 MIL/uL — ABNORMAL LOW (ref 3.87–5.11)
RDW: 14.2 % (ref 11.5–15.5)
WBC: 5.7 10*3/uL (ref 4.0–10.5)
nRBC: 0 % (ref 0.0–0.2)

## 2017-12-02 LAB — PHOSPHORUS: Phosphorus: 2.3 mg/dL — ABNORMAL LOW (ref 2.5–4.6)

## 2017-12-02 LAB — BLOOD GAS, ARTERIAL
ACID-BASE EXCESS: 0.2 mmol/L (ref 0.0–2.0)
BICARBONATE: 23.8 mmol/L (ref 20.0–28.0)
Drawn by: 100261
FIO2: 40
MECHVT: 410 mL
O2 Saturation: 94.6 %
PEEP/CPAP: 5 cmH2O
PH ART: 7.442 (ref 7.350–7.450)
Patient temperature: 99.3
RATE: 18 resp/min
pCO2 arterial: 35.6 mmHg (ref 32.0–48.0)
pO2, Arterial: 69.6 mmHg — ABNORMAL LOW (ref 83.0–108.0)

## 2017-12-02 LAB — BASIC METABOLIC PANEL
ANION GAP: 4 — AB (ref 5–15)
BUN: 22 mg/dL (ref 8–23)
CALCIUM: 7.2 mg/dL — AB (ref 8.9–10.3)
CO2: 23 mmol/L (ref 22–32)
CREATININE: 0.95 mg/dL (ref 0.44–1.00)
Chloride: 111 mmol/L (ref 98–111)
GFR calc Af Amer: >60 mL/min (ref >60)
GFR calc non Af Amer: 53 mL/min — ABNORMAL LOW (ref >60)
Glucose, Bld: 102 mg/dL — ABNORMAL HIGH (ref 70–99)
Potassium: 4.4 mmol/L (ref 3.5–5.1)
Sodium: 138 mmol/L (ref 135–145)

## 2017-12-02 LAB — URINE CULTURE
Culture: NO GROWTH
Culture: NO GROWTH
SPECIAL REQUESTS: NORMAL

## 2017-12-02 LAB — TROPONIN I
TROPONIN I: 0.16 ng/mL — AB (ref <0.03)
Troponin I: 0.21 ng/mL (ref <0.03)

## 2017-12-02 LAB — MAGNESIUM: MAGNESIUM: 1.6 mg/dL — AB (ref 1.7–2.4)

## 2017-12-02 MED ORDER — MAGNESIUM SULFATE 2 GM/50ML IV SOLN
2.0000 g | Freq: Once | INTRAVENOUS | Status: AC
  Administered 2017-12-02: 2 g via INTRAVENOUS
  Filled 2017-12-02: qty 50

## 2017-12-02 MED ORDER — SODIUM PHOSPHATES 45 MMOLE/15ML IV SOLN
10.0000 mmol | Freq: Once | INTRAVENOUS | Status: DC
  Filled 2017-12-02: qty 3.33

## 2017-12-02 NOTE — Progress Notes (Addendum)
NAME:  [name], MRN:  [id number], DOB:  [birth year], LOS: 1 ADMISSION DATE:  12/14/2017, CONSULTATION DATE:  12/23/2017 REFERRING MD:  EDP - [place], CHIEF COMPLAINT:  Respiratory failure and septic shock   Brief History   84 year old female with CVA who presents to the hospital with altered mental status. Intubated in ED Started on Levophed for sepsis, pneumonia.  Past Medical History  CVA with left sided paralysis  Significant Hospital Events   11/3 intubation for PNA  Consults: date of consult/date signed off & final recs:  PCCM  Procedures (surgical and bedside):  ETT 11/3>>> R femoral TLC 11/3>>>  R radial a-line 11/3>>>   Significant Diagnostic Tests:  CT with old right sided CVA  Micro Data:  Blood 11/3>>> Resp 11/3>>> GNR Urine 11/3>>>  Antimicrobials:  Vanc 11/3>>> Zosyn 11/3>>>   Subjective:  Off pressors. On precedex for agitation More awake today morning.  Response to commands.  Shakes head when asked about pain, dyspnea  Objective   Blood pressure (!) 104/47, pulse 95, temperature 99.2 F (37.3 C), temperature source Axillary, resp. rate (!) 22, height 5\' 3"  (1.6 m), weight 70.4 kg, SpO2 97 %.    Vent Mode: PRVC FiO2 (%):  [40 %-100 %] 40 % Set Rate:  [16 bmp-18 bmp] 18 bmp Vt Set:  [410 mL] 410 mL PEEP:  [5 cmH20] 5 cmH20 Plateau Pressure:  [18 cmH20-19 cmH20] 19 cmH20   Intake/Output Summary (Last 24 hours) at 12/02/2017 1007 Last data filed at 12/02/2017 1000 Gross per 24 hour  Intake 5461.3 ml  Output 825 ml  Net 4636.3 ml   Filed Weights   11/30/2017 1240 12/02/17 0356  Weight: 63.5 kg 70.4 kg    Examination: Gen:      No acute distress HEENT:  EOMI, sclera anicteric Neck:     No masses; no thyromegaly Lungs:    Clear to auscultation bilaterally; normal respiratory effort CV:         Regular rate and rhythm; no murmurs Abd:   PEG tube in place Ext:    No edema; adequate peripheral perfusion Skin:      Warm and dry; no  rash Neuro: Awake, interactive on the vent.  Resolved Hospital Problem list   Septic shock  Assessment & Plan:  84 year old female with LLL PNA and septic shock, concern for aspiration, respiratory failure.   Vent dependent respiratory failure Left lower lobe pneumonia, aspiration Start pressure support weaning trials Follow chest x-ray, ABG Continue antibiotics Vanco, zosyn Narrow as able based on final cultures  Elevated troponin, likely demand Follow echocardiogram.  Trend labs.  Old CVA noted on CT head Neuro monitoring  Disposition / Summary of Today's Plan 12/02/17   Continue supportive care, start weaning trials.  Will hold for extubation today Will need more medical history from family when they arrive.    Diet: N.p.o. Pain/Anxiety/Delirium protocol (if indicated): Precedex, RAS goal 1-2 VAP protocol (if indicated): Yes DVT prophylaxis: Heparin subcu GI prophylaxis: Pepcid Hyperglycemia protocol:  Mobility: Bed Code Status: Full Family Communication: no family at bedside  The patient is critically ill with multiple organ system failure and requires high complexity decision making for assessment and support, frequent evaluation and titration of therapies, advanced monitoring, review of radiographic studies and interpretation of complex data.   Critical Care Time devoted to patient care services, exclusive of separately billable procedures, described in this note is  35 minutes.   Marshell Garfinkel MD [facility] Pager [phone] If no answer or after 3pm call: [phone] 12/02/2017, 10:22 AM

## 2017-12-02 NOTE — Progress Notes (Signed)
  Echocardiogram 2D Echocardiogram has been performed.  [name] 12/02/2017, 9:01 AM

## 2017-12-03 ENCOUNTER — Inpatient Hospital Stay (HOSPITAL_COMMUNITY): Payer: Medicare Other

## 2017-12-03 LAB — PHOSPHORUS: PHOSPHORUS: 2 mg/dL — AB (ref 2.5–4.6)

## 2017-12-03 LAB — BASIC METABOLIC PANEL
ANION GAP: 7 (ref 5–15)
BUN: 18 mg/dL (ref 8–23)
CO2: 21 mmol/L — ABNORMAL LOW (ref 22–32)
CREATININE: 0.97 mg/dL (ref 0.44–1.00)
Calcium: 7.1 mg/dL — ABNORMAL LOW (ref 8.9–10.3)
Chloride: 110 mmol/L (ref 98–111)
GFR calc non Af Amer: 52 mL/min — ABNORMAL LOW (ref >60)
Glucose, Bld: 101 mg/dL — ABNORMAL HIGH (ref 70–99)
POTASSIUM: 3.4 mmol/L — AB (ref 3.5–5.1)
SODIUM: 138 mmol/L (ref 135–145)

## 2017-12-03 LAB — CBC
HCT: 28.8 % — ABNORMAL LOW (ref 36.0–46.0)
Hemoglobin: 8.9 g/dL — ABNORMAL LOW (ref 12.0–15.0)
MCH: 28.3 pg (ref 26.0–34.0)
MCHC: 30.9 g/dL (ref 30.0–36.0)
MCV: 91.7 fL (ref 80.0–100.0)
NRBC: 0 % (ref 0.0–0.2)
Platelets: 121 10*3/uL — ABNORMAL LOW (ref 150–400)
RBC: 3.14 MIL/uL — AB (ref 3.87–5.11)
RDW: 14.2 % (ref 11.5–15.5)
WBC: 7.6 10*3/uL (ref 4.0–10.5)

## 2017-12-03 LAB — GLUCOSE, CAPILLARY
GLUCOSE-CAPILLARY: 90 mg/dL (ref 70–99)
Glucose-Capillary: 106 mg/dL — ABNORMAL HIGH (ref 70–99)

## 2017-12-03 LAB — MAGNESIUM: MAGNESIUM: 2.3 mg/dL (ref 1.7–2.4)

## 2017-12-03 MED ORDER — ORAL CARE MOUTH RINSE
15.0000 mL | Freq: Two times a day (BID) | OROMUCOSAL | Status: DC
  Administered 2017-12-04 (×2): 15 mL via OROMUCOSAL

## 2017-12-03 MED ORDER — POTASSIUM PHOSPHATES 15 MMOLE/5ML IV SOLN
20.0000 mmol | Freq: Once | INTRAVENOUS | Status: AC
  Administered 2017-12-03: 20 mmol via INTRAVENOUS
  Filled 2017-12-03: qty 6.67

## 2017-12-03 MED ORDER — SODIUM CHLORIDE 0.9 % IV SOLN
1.0000 g | INTRAVENOUS | Status: DC
  Administered 2017-12-03 – 2017-12-07 (×5): 1 g via INTRAVENOUS
  Filled 2017-12-03 (×6): qty 1

## 2017-12-03 MED ORDER — JEVITY 1.2 CAL PO LIQD: 1000.0000 mL | ORAL | Status: DC

## 2017-12-03 MED ORDER — FUROSEMIDE 10 MG/ML IJ SOLN
40.0000 mg | Freq: Two times a day (BID) | INTRAMUSCULAR | Status: DC
  Administered 2017-12-03 – 2017-12-04 (×3): 40 mg via INTRAVENOUS
  Filled 2017-12-03 (×3): qty 4

## 2017-12-03 MED ORDER — JEVITY 1.2 CAL PO LIQD
1000.0000 mL | ORAL | Status: DC
  Filled 2017-12-03: qty 1000

## 2017-12-03 MED ORDER — CHLORHEXIDINE GLUCONATE 0.12 % MT SOLN
15.0000 mL | Freq: Two times a day (BID) | OROMUCOSAL | Status: DC
  Administered 2017-12-04: 15 mL via OROMUCOSAL
  Filled 2017-12-03 (×2): qty 15

## 2017-12-03 MED ORDER — DIPHENHYDRAMINE HCL 50 MG/ML IJ SOLN
12.5000 mg | Freq: Once | INTRAMUSCULAR | Status: AC
  Administered 2017-12-03: 12.5 mg via INTRAVENOUS
  Filled 2017-12-03: qty 1

## 2017-12-03 MED ORDER — METRONIDAZOLE IN NACL 5-0.79 MG/ML-% IV SOLN
500.0000 mg | Freq: Three times a day (TID) | INTRAVENOUS | Status: DC
  Administered 2017-12-03 – 2017-12-08 (×14): 500 mg via INTRAVENOUS
  Filled 2017-12-03 (×17): qty 100

## 2017-12-03 NOTE — Progress Notes (Signed)
Rash discovered on patient's left cheek, neck, chest, umbilicus, and right hip area. Pharmacy notified. RN will continue to monitor.

## 2017-12-03 NOTE — Procedures (Signed)
Extubation Procedure Note  Patient Details:   Name: [name] DOB: 1930-08-05 MRN: [id number]   Airway Documentation:    Vent end date: 12/03/17 Vent end time: 1230   Evaluation  O2 sats: stable throughout Complications: No apparent complications Patient did tolerate procedure well. Bilateral Breath Sounds: Clear, Diminished(coarse)   Yes   RT extubated patient to 3L [place] per MD order with RN at bedisde. Positive cuff leak noted. Patient current vitals are as follows: sat 96%, RR 26, HR 118 and BP 105/93. No stridor noted. RT will continue to monitor.   Vernona Rieger 12/03/2017, 12:41 PM

## 2017-12-03 NOTE — Progress Notes (Signed)
Initial Nutrition Assessment  DOCUMENTATION CODES:   Not applicable  INTERVENTION:  -Recommend initiate Jevity 1.2 @ 20 ml/hr via PEG and advance by 10 ml every 4 hours until goal rate of 50 ml/hr is reached  -Pro-stat daily via PEG  This regimen provides 1540 kcal, 81.6 grams protein, 972 ml water   -Assume refeeding risk; Continue monitoring potassium, phosphorus, magnesium labs and replete as needed   NUTRITION DIAGNOSIS:   Inadequate oral intake related to inability to eat as evidenced by NPO status.  GOAL:   Patient will meet greater than or equal to 90% of their needs  MONITOR:   TF tolerance, Labs, I & O's, Weight trends  REASON FOR ASSESSMENT:   Rounds(pt has PEG)    ASSESSMENT:  Ms. Altemus is an 84 yo female with PMH CVA left sided paralysis, who presents to the hospital via EMS after being found unresponsive by son. Pt intubated 11/3 in ED due to inability to protect her airway.   Pt extubated 11/4 at 1230 to 3L [place].  Per chart pt's home diet listed as "Ensure per PEG tube vanilla/strawberry".  Visited pt in room. Pt unresponsive; no family at bedside. Unable to obtain diet or wt hx. Spoke with Vickii Chafe, RN who confirms pt has PEG, believes it is ready to use.   Due to lack of hx unable to determine formula, rate used in PEG or when it was last used. Assume refeeding risk and continue monitoring labs.   Labs: potassium 3.4, phosphorus 2.0, magnesium 2.3, CBG 101 Meds: reviewed.   NUTRITION - FOCUSED PHYSICAL EXAM:    Most Recent Value  Orbital Region  No depletion  Upper Arm Region  No depletion  Thoracic and Lumbar Region  No depletion  Buccal Region  No depletion  Temple Region  Moderate depletion  Clavicle Bone Region  Mild depletion  Clavicle and Acromion Bone Region  Mild depletion  Scapular Bone Region  Mild depletion  Dorsal Hand  Unable to assess  Patellar Region  Mild depletion  Anterior Thigh Region  Mild depletion  Posterior Calf Region   Mild depletion  Edema (RD Assessment)  Mild  Hair  Reviewed  Eyes  Reviewed  Mouth  Unable to assess  Skin  Reviewed  Nails  Unable to assess      Diet Order:   Diet Order            Diet NPO time specified  Diet effective now              EDUCATION NEEDS:   No education needs have been identified at this time  Skin:  Skin Assessment: Reviewed RN Assessment  Last BM:  PTA  Height:   Ht Readings from Last 1 Encounters:  12/11/2017 5\' 3"  (1.6 m)    Weight:   Wt Readings from Last 1 Encounters:  12/03/17 72.6 kg    Ideal Body Weight:  52.3 kg  BMI:  Body mass index is 28.35 kg/m.  Estimated Nutritional Needs:   Kcal:  1400-1600  Protein:  70-80 grams  Fluid:  >/=1.4 L    Nethaniel Mattie, Dietetic Intern [phone]

## 2017-12-03 NOTE — Progress Notes (Addendum)
NAME:  [name], MRN:  [id number], DOB:  Jul 28, 1930, LOS: 2 ADMISSION DATE:  11/30/2017, CONSULTATION DATE:  12/28/2017 REFERRING MD:  EDP - [place], CHIEF COMPLAINT:  Respiratory failure and septic shock   Brief History   84 year old female with CVA who presents to the hospital with altered mental status. Intubated in ED Started on Levophed for sepsis, pneumonia.  Past Medical History  CVA with left sided paralysis  Significant Hospital Events   11/3 intubation for PNA  Consults: date of consult/date signed off & final recs:  PCCM  Procedures (surgical and bedside):  ETT 11/3>>> R femoral TLC 11/3>>>  R radial a-line 11/3>>>   Significant Diagnostic Tests:  CT with old right sided CVA  Micro Data:  Blood 11/3>>> Resp 11/3>>> GNR Urine 11/3>>>  Antimicrobials:  Vanc 11/3>>> Zosyn 11/3>>>   Subjective:  Off pressors. On precedex for agitation More awake today morning.  Response to commands.  Shakes head when asked about pain, dyspnea  Objective   Blood pressure 131/66, pulse 95, temperature (!) 97.2 F (36.2 C), resp. rate 20, height 5\' 3"  (1.6 m), weight 72.6 kg, SpO2 100 %.    Vent Mode: CPAP;PSV FiO2 (%):  [40 %] 40 % Set Rate:  [18 bmp] 18 bmp Vt Set:  [410 mL] 410 mL PEEP:  [5 cmH20] 5 cmH20 Pressure Support:  [10 cmH20] 10 cmH20 Plateau Pressure:  [13 cmH20-22 cmH20] 22 cmH20   Intake/Output Summary (Last 24 hours) at 12/03/2017 1108 Last data filed at 12/03/2017 0600 Gross per 24 hour  Intake 2970.97 ml  Output 630 ml  Net 2340.97 ml   Filed Weights   12/24/2017 1240 12/02/17 0356 12/03/17 0410  Weight: 63.5 kg 70.4 kg 72.6 kg    Examination: Gen:      No acute distress HEENT:  EOMI, sclera anicteric Neck:     No masses; no thyromegaly Lungs:    Clear to auscultation bilaterally; normal respiratory effort CV:         Regular rate and rhythm; no murmurs Abd:      + bowel sounds; soft, non-tender; no palpable masses, no distension Ext:     No edema; adequate peripheral perfusion Skin:      Warm and dry; no rash Neuro: Awake on vent. No distress  Resolved Hospital Problem list   Septic shock  Assessment & Plan:  84 year old female with LLL PNA and septic shock, concern for aspiration, respiratory failure.   Vent dependent respiratory failure Left lower lobe pneumonia, aspiration Start pressure support weaning trials Follow chest x-ray, ABG Continue antibiotics zosyn. Stop vanco Narrow as able based on final cultures  Elevated troponin, likely demand Start Lasix for diuresis  Old CVA noted on CT head NPO, chronic PEG tube for feeds Neuro monitoring  Hypokalemia, low phos Replete lytes  Disposition / Summary of Today's Plan 12/03/17   Continue supportive care, start weaning trials.   Starting Lasix for diuresis. Narrow antibiotics to Zosyn.      Diet: N.p.o. Pain/Anxiety/Delirium protocol (if indicated): Precedex, RAS goal 1-2 VAP protocol (if indicated): Yes DVT prophylaxis: Heparin subcu GI prophylaxis: Pepcid Hyperglycemia protocol:  Mobility: Bed Code Status: Full Family Communication: Son updated at bedside 11/5  The patient is critically ill with multiple organ system failure and requires high complexity decision making for assessment and support, frequent evaluation and titration of therapies, advanced monitoring, review of radiographic studies and interpretation of complex data.   Critical Care Time devoted to  patient care services, exclusive of separately billable procedures, described in this note is  35 minutes.   Marshell Garfinkel MD [facility] Pager [phone] If no answer or after 3pm call: [phone] 12/03/2017, 11:08 AM

## 2017-12-03 NOTE — Progress Notes (Signed)
Pharmacy Antibiotic Note  [name] is a 84 y.o. female admitted on 12/03/2017 with pneumonia.  Pharmacy has been consulted for cefepime dosing.  Started developing rash on chest/abdomen and has spread for the past several hours to extremities and face. Has been on Zosyn for about 2 days, my assumption would be that this is a delayed reaction to the Zosyn over other possibilities. Other possible cause might be the Lasix. Discussed with RNs and Dr. Oletta Darter over at [facility].  Plan: Stop Zosyn Give diphenhydramine 12.5mg  IV x 1 Start cefepime 1g IV Q24h tonight Start Flagyl 500mg  IV Q8h tonight Monitor clinical picture, renal function F/U C&S, abx deescalation / LOT  F/U improvement in rash / need to stop Lasix  Height: 5\' 3"  (160 cm) Weight: 160 lb 0.9 oz (72.6 kg) IBW/kg (Calculated) : 52.4  Temp (24hrs), Avg:97.8 F (36.6 C), Min:96.3 F (35.7 C), Max:99 F (37.2 C)  Recent Labs  Lab 12/26/2017 1216 12/24/2017 1233 12/23/2017 1435 12/02/17 0513 12/03/17 0418  WBC 4.5  --   --  5.7 7.6  CREATININE 1.17*  --   --  0.95 0.97  LATICACIDVEN  --  4.56* 3.35*  --   --     Estimated Creatinine Clearance: 40.5 mL/min (by C-G formula based on SCr of 0.97 mg/dL).    No Known Allergies   Thank you for allowing pharmacy to be a part of this patient's care.  Reginia Naas 12/03/2017 7:30 PM

## 2017-12-04 ENCOUNTER — Inpatient Hospital Stay (HOSPITAL_COMMUNITY): Payer: Medicare Other

## 2017-12-04 DIAGNOSIS — R4182 Altered mental status, unspecified: Secondary | ICD-10-CM

## 2017-12-04 DIAGNOSIS — J9600 Acute respiratory failure, unspecified whether with hypoxia or hypercapnia: Secondary | ICD-10-CM

## 2017-12-04 LAB — BASIC METABOLIC PANEL
Anion gap: 14 (ref 5–15)
Anion gap: 14 (ref 5–15)
BUN: 10 mg/dL (ref 8–23)
BUN: 15 mg/dL (ref 8–23)
CALCIUM: 7.1 mg/dL — AB (ref 8.9–10.3)
CO2: 26 mmol/L (ref 22–32)
CO2: 25 mmol/L (ref 22–32)
CREATININE: 1.08 mg/dL — AB (ref 0.44–1.00)
CREATININE: 1.06 mg/dL — AB (ref 0.44–1.00)
Calcium: 7.3 mg/dL — ABNORMAL LOW (ref 8.9–10.3)
Chloride: 99 mmol/L (ref 98–111)
Chloride: 101 mmol/L (ref 98–111)
GFR, EST AFRICAN AMERICAN: 53 mL/min — AB (ref >60)
GFR, EST AFRICAN AMERICAN: 54 mL/min — AB (ref >60)
GFR, EST NON AFRICAN AMERICAN: 45 mL/min — AB (ref >60)
GFR, EST NON AFRICAN AMERICAN: 47 mL/min — AB (ref >60)
Glucose, Bld: 91 mg/dL (ref 70–99)
Glucose, Bld: 160 mg/dL — ABNORMAL HIGH (ref 70–99)
POTASSIUM: 2.8 mmol/L — AB (ref 3.5–5.1)
Potassium: 3.3 mmol/L — ABNORMAL LOW (ref 3.5–5.1)
SODIUM: 139 mmol/L (ref 135–145)
SODIUM: 140 mmol/L (ref 135–145)

## 2017-12-04 LAB — CBC
HCT: 37.5 % (ref 36.0–46.0)
Hemoglobin: 12.1 g/dL (ref 12.0–15.0)
MCH: 28.4 pg (ref 26.0–34.0)
MCHC: 32.3 g/dL (ref 30.0–36.0)
MCV: 88 fL (ref 80.0–100.0)
PLATELETS: 237 10*3/uL (ref 150–400)
RBC: 4.26 MIL/uL (ref 3.87–5.11)
RDW: 14.2 % (ref 11.5–15.5)
WBC: 15.5 10*3/uL — ABNORMAL HIGH (ref 4.0–10.5)
nRBC: 0.1 % (ref 0.0–0.2)

## 2017-12-04 LAB — POCT I-STAT 3, ART BLOOD GAS (G3+)
ACID-BASE DEFICIT: 2 mmol/L (ref 0.0–2.0)
Bicarbonate: 25.9 mmol/L (ref 20.0–28.0)
O2 Saturation: 96 %
PH ART: 7.274 — AB (ref 7.350–7.450)
TCO2: 28 mmol/L (ref 22–32)
pCO2 arterial: 56.1 mmHg — ABNORMAL HIGH (ref 32.0–48.0)
pO2, Arterial: 96 mmHg (ref 83.0–108.0)

## 2017-12-04 LAB — MAGNESIUM: MAGNESIUM: 1.7 mg/dL (ref 1.7–2.4)

## 2017-12-04 LAB — GLUCOSE, CAPILLARY
GLUCOSE-CAPILLARY: 207 mg/dL — AB (ref 70–99)
GLUCOSE-CAPILLARY: 233 mg/dL — AB (ref 70–99)
GLUCOSE-CAPILLARY: 98 mg/dL (ref 70–99)

## 2017-12-04 LAB — CULTURE, RESPIRATORY W GRAM STAIN: Culture: NORMAL

## 2017-12-04 LAB — CULTURE, RESPIRATORY

## 2017-12-04 LAB — PHOSPHORUS: PHOSPHORUS: 1.6 mg/dL — AB (ref 2.5–4.6)

## 2017-12-04 MED ORDER — MIDAZOLAM HCL 2 MG/2ML IJ SOLN
INTRAMUSCULAR | Status: AC
  Administered 2017-12-04: 2 mg via INTRAVENOUS
  Filled 2017-12-04: qty 2

## 2017-12-04 MED ORDER — JEVITY 1.2 CAL PO LIQD
1000.0000 mL | ORAL | Status: DC
  Administered 2017-12-04 – 2017-12-06 (×3): 1000 mL
  Filled 2017-12-04 (×8): qty 1000

## 2017-12-04 MED ORDER — SODIUM CHLORIDE 0.9 % IV BOLUS
1000.0000 mL | Freq: Once | INTRAVENOUS | Status: AC
  Administered 2017-12-04: 1000 mL via INTRAVENOUS

## 2017-12-04 MED ORDER — MIDAZOLAM HCL 2 MG/2ML IJ SOLN
1.0000 mg | INTRAMUSCULAR | Status: DC | PRN
  Administered 2017-12-04 – 2017-12-05 (×2): 1 mg via INTRAVENOUS
  Filled 2017-12-04: qty 2

## 2017-12-04 MED ORDER — MIDAZOLAM HCL 2 MG/2ML IJ SOLN
2.0000 mg | Freq: Once | INTRAMUSCULAR | Status: AC
  Administered 2017-12-04: 2 mg via INTRAVENOUS

## 2017-12-04 MED ORDER — FENTANYL CITRATE (PF) 100 MCG/2ML IJ SOLN
50.0000 ug | Freq: Once | INTRAMUSCULAR | Status: AC
  Administered 2017-12-04: 50 ug via INTRAVENOUS

## 2017-12-04 MED ORDER — FENTANYL CITRATE (PF) 100 MCG/2ML IJ SOLN
INTRAMUSCULAR | Status: AC
  Administered 2017-12-04: 50 ug via INTRAVENOUS
  Filled 2017-12-04: qty 2

## 2017-12-04 MED ORDER — LORAZEPAM 2 MG/ML IJ SOLN
INTRAMUSCULAR | Status: AC
  Administered 2017-12-04: 2 mg via INTRAVENOUS
  Filled 2017-12-04: qty 1

## 2017-12-04 MED ORDER — INSULIN ASPART 100 UNIT/ML ~~LOC~~ SOLN
0.0000 [IU] | SUBCUTANEOUS | Status: DC
  Administered 2017-12-04: 5 [IU] via SUBCUTANEOUS
  Administered 2017-12-05: 2 [IU] via SUBCUTANEOUS
  Administered 2017-12-05 (×2): 3 [IU] via SUBCUTANEOUS
  Administered 2017-12-05: 5 [IU] via SUBCUTANEOUS
  Administered 2017-12-05 – 2017-12-06 (×4): 3 [IU] via SUBCUTANEOUS
  Administered 2017-12-06: 2 [IU] via SUBCUTANEOUS
  Administered 2017-12-06 – 2017-12-07 (×4): 3 [IU] via SUBCUTANEOUS
  Administered 2017-12-07: 2 [IU] via SUBCUTANEOUS
  Administered 2017-12-07: 5 [IU] via SUBCUTANEOUS
  Administered 2017-12-07 (×2): 3 [IU] via SUBCUTANEOUS
  Administered 2017-12-07: 5 [IU] via SUBCUTANEOUS
  Administered 2017-12-08 (×2): 3 [IU] via SUBCUTANEOUS
  Administered 2017-12-08: 2 [IU] via SUBCUTANEOUS

## 2017-12-04 MED ORDER — FENTANYL CITRATE (PF) 100 MCG/2ML IJ SOLN
50.0000 ug | INTRAMUSCULAR | Status: DC | PRN
  Administered 2017-12-05: 50 ug via INTRAVENOUS
  Filled 2017-12-04: qty 2

## 2017-12-04 MED ORDER — LORAZEPAM 2 MG/ML IJ SOLN
2.0000 mg | Freq: Once | INTRAMUSCULAR | Status: AC
  Administered 2017-12-04: 2 mg via INTRAVENOUS

## 2017-12-04 MED ORDER — ETOMIDATE 2 MG/ML IV SOLN
0.3000 mg/kg | Freq: Once | INTRAVENOUS | Status: AC
  Administered 2017-12-04: 20 mg via INTRAVENOUS

## 2017-12-04 MED ORDER — POTASSIUM CHLORIDE 20 MEQ/15ML (10%) PO SOLN
40.0000 meq | Freq: Once | ORAL | Status: AC
  Administered 2017-12-04: 40 meq
  Filled 2017-12-04: qty 30

## 2017-12-04 MED ORDER — METHYLPREDNISOLONE SODIUM SUCC 125 MG IJ SOLR
40.0000 mg | Freq: Four times a day (QID) | INTRAMUSCULAR | Status: DC
  Administered 2017-12-04 – 2017-12-06 (×8): 40 mg via INTRAVENOUS
  Filled 2017-12-04 (×8): qty 2

## 2017-12-04 MED ORDER — FUROSEMIDE 10 MG/ML IJ SOLN
40.0000 mg | Freq: Once | INTRAMUSCULAR | Status: AC
  Administered 2017-12-04: 40 mg via INTRAVENOUS
  Filled 2017-12-04: qty 4

## 2017-12-04 MED ORDER — SENNOSIDES 8.8 MG/5ML PO SYRP
5.0000 mL | ORAL_SOLUTION | Freq: Every evening | ORAL | Status: DC | PRN
  Administered 2017-12-08: 5 mL via ORAL
  Filled 2017-12-04 (×3): qty 5

## 2017-12-04 MED ORDER — LORAZEPAM 0.5 MG PO TABS
0.5000 mg | ORAL_TABLET | ORAL | Status: DC | PRN
  Administered 2017-12-04: 1 mg via ORAL
  Filled 2017-12-04: qty 2

## 2017-12-04 MED ORDER — SODIUM CHLORIDE 0.9 % IV BOLUS
500.0000 mL | Freq: Once | INTRAVENOUS | Status: AC
  Administered 2017-12-04: 500 mL via INTRAVENOUS

## 2017-12-04 MED ORDER — POTASSIUM CHLORIDE 20 MEQ/15ML (10%) PO SOLN
80.0000 meq | Freq: Once | ORAL | Status: AC
  Administered 2017-12-04: 80 meq
  Filled 2017-12-04: qty 60

## 2017-12-04 MED ORDER — DEXMEDETOMIDINE HCL IN NACL 400 MCG/100ML IV SOLN
0.4000 ug/kg/h | INTRAVENOUS | Status: DC
  Administered 2017-12-04: 1.2 ug/kg/h via INTRAVENOUS
  Administered 2017-12-04: 0.4 ug/kg/h via INTRAVENOUS
  Administered 2017-12-05: 1 ug/kg/h via INTRAVENOUS
  Filled 2017-12-04 (×3): qty 100

## 2017-12-04 MED ORDER — CHLORHEXIDINE GLUCONATE 0.12% ORAL RINSE (MEDLINE KIT)
15.0000 mL | Freq: Two times a day (BID) | OROMUCOSAL | Status: DC
  Administered 2017-12-04 – 2017-12-12 (×16): 15 mL via OROMUCOSAL

## 2017-12-04 MED ORDER — HALOPERIDOL LACTATE 5 MG/ML IJ SOLN: 1.0000 mg | INTRAMUSCULAR | Status: DC | PRN

## 2017-12-04 MED ORDER — ROCURONIUM BROMIDE 50 MG/5ML IV SOLN
1.0000 mg/kg | Freq: Once | INTRAVENOUS | Status: AC
  Administered 2017-12-04: 60 mg via INTRAVENOUS
  Filled 2017-12-04: qty 7.02

## 2017-12-04 MED ORDER — DOCUSATE SODIUM 50 MG/5ML PO LIQD
100.0000 mg | Freq: Every day | ORAL | Status: DC | PRN
  Administered 2017-12-04: 100 mg via ORAL
  Filled 2017-12-04: qty 10

## 2017-12-04 MED ORDER — PROMETHAZINE HCL 25 MG/ML IJ SOLN
12.5000 mg | Freq: Three times a day (TID) | INTRAMUSCULAR | Status: DC | PRN
  Administered 2017-12-04: 12.5 mg via INTRAVENOUS
  Filled 2017-12-04: qty 1

## 2017-12-04 MED ORDER — ORAL CARE MOUTH RINSE
15.0000 mL | OROMUCOSAL | Status: DC
  Administered 2017-12-04 – 2017-12-12 (×74): 15 mL via OROMUCOSAL

## 2017-12-04 MED ORDER — MIDAZOLAM HCL 2 MG/2ML IJ SOLN
1.0000 mg | INTRAMUSCULAR | Status: DC | PRN
  Filled 2017-12-04: qty 2

## 2017-12-04 MED ORDER — PRO-STAT SUGAR FREE PO LIQD
30.0000 mL | Freq: Every day | ORAL | Status: DC
  Administered 2017-12-04 – 2017-12-08 (×5): 30 mL
  Filled 2017-12-04 (×5): qty 30

## 2017-12-04 MED ORDER — IPRATROPIUM-ALBUTEROL 0.5-2.5 (3) MG/3ML IN SOLN
3.0000 mL | RESPIRATORY_TRACT | Status: DC | PRN
  Administered 2017-12-04: 3 mL via RESPIRATORY_TRACT
  Filled 2017-12-04: qty 3

## 2017-12-04 NOTE — Progress Notes (Addendum)
PCCM interval note  Patient is increasingly agitated, delirious through the day Trying to remove BiPAP mask  Blood pressure (!) 125/57, pulse (!) 114, temperature 100 F (37.8 C), resp. rate (!) 35, height 5\' 3"  (1.6 m), weight 70.2 kg, SpO2 91 %. Gen:      Moderate distress HEENT:  EOMI, sclera anicteric Neck:     No masses; no thyromegaly Lungs:    Rhonchorous breath sounds, wheeze CV:         Regular rate and rhythm; no murmurs Abd:      + bowel sounds; soft, non-tender; no palpable masses, no distension Ext:    No edema; adequate peripheral perfusion Skin:      Warm and dry; no rash Neuro: Awake, moves all extremities, does not follow commands.  Assessment/plan Agitation, delirium Volume overload with wheezing Treat for COPD exacerbation with bronchodilators, IV steroids Lasix for diuresis Restarted on Precedex, Versed as needed. Cannot use Haldol for because of high QTC  Called son to clarify CODE STATUS but could he did not answer.  The patient is critically ill with multiple organ system failure and requires high complexity decision making for assessment and support, frequent evaluation and titration of therapies, advanced monitoring, review of radiographic studies and interpretation of complex data.   Critical Care Time devoted to patient care services, exclusive of separately billable procedures, described in this note is 20 minutes.   Marshell Garfinkel MD [facility] Pager [phone] If no answer or after 3pm call: [phone] 12/04/2017, 2:55 PM

## 2017-12-04 NOTE — Progress Notes (Signed)
Patient has had increased WOB throughout the day. Patient appears agitated and distressed. MD and RT at bedside. Patient was reintubated. Will continue to monitor.

## 2017-12-04 NOTE — Progress Notes (Addendum)
NAME:  [name], MRN:  [id number], DOB:  1930-08-16, LOS: 3 ADMISSION DATE:  12/18/2017, CONSULTATION DATE:  12/09/2017 REFERRING MD:  EDP - [place], CHIEF COMPLAINT:  Respiratory failure and septic shock   Brief History   84 year old female with CVA who presents to the hospital with altered mental status. Intubated in ED Started on Levophed for sepsis, pneumonia.  Past Medical History  CVA with left sided paralysis  Significant Hospital Events   11/3 intubation for PNA 11/5 Extuabted  Consults: date of consult/date signed off & final recs:  PCCM  Procedures (surgical and bedside):  ETT 11/3>>> 11/6 R femoral TLC 11/3>>>  R radial a-line 11/3>>> 11/5  Significant Diagnostic Tests:  CT with old right sided CVA  Micro Data:  Blood 11/3>>> Resp 11/3>>> Normal resp flora Urine 11/3>>>  Antimicrobials:  Vanc 11/3>>> 11/5 Zosyn 11/3>>> 11/5 Cefepime 11/6 >> Flagyl 11/6 >>  Subjective:  Extubated yesterday Had respiratory distress overnight requiring BiPAP  Delirious today morning.  Nonverbal.  Moves all extremities.  Objective   Blood pressure 140/79, pulse (!) 109, temperature 99.3 F (37.4 C), resp. rate (!) 44, height 5\' 3"  (1.6 m), weight 70.2 kg, SpO2 93 %.    FiO2 (%):  [40 %] 40 %   Intake/Output Summary (Last 24 hours) at 12/04/2017 1129 Last data filed at 12/04/2017 1000 Gross per 24 hour  Intake 3338.71 ml  Output 7675 ml  Net -4336.29 ml   Filed Weights   12/02/17 0356 12/03/17 0410 12/04/17 0437  Weight: 70.4 kg 72.6 kg 70.2 kg    Examination: Gen:      Frail, elderly HEENT:  EOMI, sclera anicteric Neck:     No masses; no thyromegaly Lungs:    Clear to auscultation bilaterally; normal respiratory effort CV:         Regular rate and rhythm; no murmurs Abd:      + bowel sounds; soft, non-tender; no palpable masses, no distension Ext:    No edema; adequate peripheral perfusion Skin:      Warm and dry; no rash Neuro: Awake, moves all  extremities.  Does not follow commands.  Resolved Hospital Problem list   Septic shock  Assessment & Plan:  84 year old female with LLL PNA and septic shock, concern for aspiration, respiratory failure.   Vent dependent respiratory failure Left lower lobe pneumonia, aspiration Extubated.  Has increasing respiratory distress with wheezing today Start Solu-Medrol for wheezing, standing nebulizers Zosyn stopped for rash.  Continue cefepime, Flagyl. BiPAP as needed.  Elevated troponin, likely demand > trending down Increase Lasix dose  Old CVA noted on CT head NPO, chronic PEG tube for feeds Neuro monitoring  Hypokalemia, low phos Replete lytes, replete phos  Disposition / Summary of Today's Plan 12/04/17   Continue BiPAP as needed Start steroids, nebs.  Increase Lasix for diuresis Close monitoring in ICU as she may require intubation.      Diet: N.p.o. Pain/Anxiety/Delirium protocol (if indicated): Precedex as needed VAP protocol (if indicated): Yes DVT prophylaxis: Heparin subcu GI prophylaxis: Pepcid Hyperglycemia protocol:  Mobility: Bed Code Status: Full Family Communication: Son updated at bedside 11/5  The patient is critically ill with multiple organ system failure and requires high complexity decision making for assessment and support, frequent evaluation and titration of therapies, advanced monitoring, review of radiographic studies and interpretation of complex data.   Critical Care Time devoted to patient care services, exclusive of separately billable procedures, described in this note is  35 minutes.   Marshell Garfinkel MD [facility] Pager [phone] If no answer or after 3pm call: [phone] 12/04/2017, 11:29 AM

## 2017-12-04 NOTE — Progress Notes (Signed)
[facility] Progress Note Patient Name: [name] DOB: [birth year] MRN: [id number]   Date of Service  12/04/2017  HPI/Events of Note  Nursing reports progressive increase in O2 requirement, RR and WOB. Sat = 94^% on [facility] O2. However, her RR = 38.   eICU Interventions  Will order: 1. BiPAP. 2. Portable CXR STAT. 3. Lasix 40 mg IV X 1 now.  4. Decrease 0.9 NaCl from 125 mL/hour to 50 mL/hour.      Intervention Category Major Interventions: Hypoxemia - evaluation and management  Sommer,Steven Eugene 12/04/2017, 4:27 AM

## 2017-12-04 NOTE — Procedures (Signed)
Intubation Procedure Note [name] [id number] 09/28/1930  Procedure: Intubation Indications: Airway protection and maintenance  Procedure Details Consent: Risks of procedure as well as the alternatives and risks of each were explained to the (patient/caregiver).  Consent for procedure obtained. Time Out: Verified patient identification, verified procedure, site/side was marked, verified correct patient position, special equipment/implants available, medications/allergies/relevent history reviewed, required imaging and test results available.  Performed  Maximum sterile technique was used including cap, gloves and gown.  Miller size 3 1 attempt. Grade 1 view of cords  Evaluation Hemodynamic Status: BP stable throughout; O2 sats: transiently fell during during procedure Patient's Current Condition: stable Complications: No apparent complications Patient did tolerate procedure well. Chest X-ray ordered to verify placement.  CXR: pending.  Marshell Garfinkel MD [facility] 12/04/2017, 5:11 PM

## 2017-12-04 NOTE — Progress Notes (Signed)
[facility] Progress Note Patient Name: [name] DOB: 1930/02/15 MRN: [id number]   Date of Service  12/04/2017  HPI/Events of Note  Nausea - QTC interval = 0.510 seconds.   eICU Interventions  Will order: 1. Phenergan 12.5 mg IV Q 8 hours PRN N/V.     Intervention Category Major Interventions: Other:  Sharicka Pogorzelski Cornelia Copa 12/04/2017, 12:28 AM

## 2017-12-04 NOTE — Progress Notes (Signed)
[facility] Progress Note Patient Name: [name] DOB: 10-25-1930 MRN: [id number]   Date of Service  12/04/2017  HPI/Events of Note  Low BP, not on sedation  eICU Interventions  Will give saline bolus     Intervention Category Intermediate Interventions: Hypovolemia - evaluation and management  Flora Lipps 12/04/2017, 8:37 PM

## 2017-12-05 ENCOUNTER — Inpatient Hospital Stay: Payer: Self-pay

## 2017-12-05 ENCOUNTER — Inpatient Hospital Stay (HOSPITAL_COMMUNITY): Payer: Medicare Other

## 2017-12-05 DIAGNOSIS — J80 Acute respiratory distress syndrome: Secondary | ICD-10-CM

## 2017-12-05 LAB — BLOOD GAS, ARTERIAL
Acid-Base Excess: 2.9 mmol/L — ABNORMAL HIGH (ref 0.0–2.0)
Bicarbonate: 26 mmol/L (ref 20.0–28.0)
DELIVERY SYSTEMS: POSITIVE
Drawn by: 51185
Expiratory PAP: 6
FIO2: 60
Inspiratory PAP: 12
O2 Saturation: 94.1 %
PATIENT TEMPERATURE: 98.6
RATE: 8 resp/min
pCO2 arterial: 33.5 mmHg (ref 32.0–48.0)
pH, Arterial: 7.502 — ABNORMAL HIGH (ref 7.350–7.450)
pO2, Arterial: 64.6 mmHg — ABNORMAL LOW (ref 83.0–108.0)

## 2017-12-05 LAB — LACTIC ACID, PLASMA
LACTIC ACID, VENOUS: 4.7 mmol/L — AB (ref 0.5–1.9)
LACTIC ACID, VENOUS: 4.3 mmol/L — AB (ref 0.5–1.9)
LACTIC ACID, VENOUS: 2.7 mmol/L — AB (ref 0.5–1.9)

## 2017-12-05 LAB — PHOSPHORUS: Phosphorus: <1 mg/dL — CL (ref 2.5–4.6)

## 2017-12-05 LAB — POCT I-STAT 3, ART BLOOD GAS (G3+)
Acid-base deficit: 1 mmol/L (ref 0.0–2.0)
BICARBONATE: 23 mmol/L (ref 20.0–28.0)
O2 Saturation: 99 %
PCO2 ART: 32.4 mmHg (ref 32.0–48.0)
PO2 ART: 123 mmHg — AB (ref 83.0–108.0)
Patient temperature: 35.8
TCO2: 24 mmol/L (ref 22–32)
pH, Arterial: 7.454 — ABNORMAL HIGH (ref 7.350–7.450)

## 2017-12-05 LAB — CBC
HCT: 32.9 % — ABNORMAL LOW (ref 36.0–46.0)
Hemoglobin: 10 g/dL — ABNORMAL LOW (ref 12.0–15.0)
MCH: 27.4 pg (ref 26.0–34.0)
MCHC: 30.4 g/dL (ref 30.0–36.0)
MCV: 90.1 fL (ref 80.0–100.0)
NRBC: 0.4 % — AB (ref 0.0–0.2)
PLATELETS: 199 10*3/uL (ref 150–400)
RBC: 3.65 MIL/uL — AB (ref 3.87–5.11)
RDW: 14.2 % (ref 11.5–15.5)
WBC: 8.4 10*3/uL (ref 4.0–10.5)

## 2017-12-05 LAB — TROPONIN I: TROPONIN I: 2.43 ng/mL — AB (ref <0.03)

## 2017-12-05 LAB — GLUCOSE, CAPILLARY
GLUCOSE-CAPILLARY: 176 mg/dL — AB (ref 70–99)
GLUCOSE-CAPILLARY: 152 mg/dL — AB (ref 70–99)
GLUCOSE-CAPILLARY: 156 mg/dL — AB (ref 70–99)
Glucose-Capillary: 137 mg/dL — ABNORMAL HIGH (ref 70–99)
Glucose-Capillary: 190 mg/dL — ABNORMAL HIGH (ref 70–99)
Glucose-Capillary: 208 mg/dL — ABNORMAL HIGH (ref 70–99)

## 2017-12-05 LAB — BASIC METABOLIC PANEL
ANION GAP: 11 (ref 5–15)
BUN: 21 mg/dL (ref 8–23)
CO2: 24 mmol/L (ref 22–32)
Calcium: 7.1 mg/dL — ABNORMAL LOW (ref 8.9–10.3)
Chloride: 108 mmol/L (ref 98–111)
Creatinine, Ser: 1.15 mg/dL — ABNORMAL HIGH (ref 0.44–1.00)
GFR calc Af Amer: 49 mL/min — ABNORMAL LOW (ref >60)
GFR, EST NON AFRICAN AMERICAN: 42 mL/min — AB (ref >60)
Glucose, Bld: 222 mg/dL — ABNORMAL HIGH (ref 70–99)
POTASSIUM: 4 mmol/L (ref 3.5–5.1)
SODIUM: 143 mmol/L (ref 135–145)

## 2017-12-05 LAB — MAGNESIUM: MAGNESIUM: 1.8 mg/dL (ref 1.7–2.4)

## 2017-12-05 MED ORDER — LACTATED RINGERS IV BOLUS
500.0000 mL | Freq: Once | INTRAVENOUS | Status: AC
  Administered 2017-12-05: 500 mL via INTRAVENOUS

## 2017-12-05 MED ORDER — SODIUM CHLORIDE 0.9% FLUSH
10.0000 mL | Freq: Two times a day (BID) | INTRAVENOUS | Status: DC
  Administered 2017-12-05 – 2017-12-06 (×4): 10 mL
  Administered 2017-12-07: 20 mL
  Administered 2017-12-08: 10 mL

## 2017-12-05 MED ORDER — SODIUM CHLORIDE 0.9 % IV BOLUS
1000.0000 mL | Freq: Once | INTRAVENOUS | Status: AC
  Administered 2017-12-05: 1000 mL via INTRAVENOUS

## 2017-12-05 MED ORDER — NOREPINEPHRINE 4 MG/250ML-% IV SOLN: 0.0000 ug/min | INTRAVENOUS | Status: DC

## 2017-12-05 MED ORDER — SODIUM PHOSPHATES 45 MMOLE/15ML IV SOLN
30.0000 mmol | Freq: Once | INTRAVENOUS | Status: AC
  Administered 2017-12-05: 30 mmol via INTRAVENOUS
  Filled 2017-12-05: qty 10

## 2017-12-05 MED ORDER — MIDAZOLAM HCL 2 MG/2ML IJ SOLN
1.0000 mg | INTRAMUSCULAR | Status: DC | PRN
  Administered 2017-12-05 (×2): 1 mg via INTRAVENOUS
  Filled 2017-12-05: qty 2

## 2017-12-05 MED ORDER — FENTANYL 2500MCG IN NS 250ML (10MCG/ML) PREMIX INFUSION
25.0000 ug/h | INTRAVENOUS | Status: DC
  Administered 2017-12-05: 100 ug/h via INTRAVENOUS
  Administered 2017-12-06: 150 ug/h via INTRAVENOUS
  Administered 2017-12-06: 200 ug/h via INTRAVENOUS
  Administered 2017-12-07 – 2017-12-08 (×2): 175 ug/h via INTRAVENOUS
  Filled 2017-12-05 (×5): qty 250

## 2017-12-05 MED ORDER — INSULIN GLARGINE 100 UNIT/ML ~~LOC~~ SOLN
5.0000 [IU] | Freq: Every day | SUBCUTANEOUS | Status: DC
  Administered 2017-12-05 – 2017-12-08 (×4): 5 [IU] via SUBCUTANEOUS
  Filled 2017-12-05 (×5): qty 0.05

## 2017-12-05 MED ORDER — SODIUM CHLORIDE 0.9% FLUSH: 10.0000 mL | INTRAVENOUS | Status: DC | PRN

## 2017-12-05 MED ORDER — MIDAZOLAM HCL 2 MG/2ML IJ SOLN
1.0000 mg | INTRAMUSCULAR | Status: AC | PRN
  Administered 2017-12-05 – 2017-12-08 (×3): 1 mg via INTRAVENOUS
  Filled 2017-12-05 (×3): qty 2

## 2017-12-05 MED ORDER — FENTANYL BOLUS VIA INFUSION
25.0000 ug | INTRAVENOUS | Status: DC | PRN
  Administered 2017-12-05: 25 ug via INTRAVENOUS
  Administered 2017-12-05: 50 ug via INTRAVENOUS
  Administered 2017-12-05: 25 ug via INTRAVENOUS
  Filled 2017-12-05: qty 25

## 2017-12-05 NOTE — Progress Notes (Signed)
This note also relates to the following rows which could not be included: SpO2 - Cannot attach notes to unvalidated device data  Patient's VT dropped to 6 cc's (310) and RR increased to 22 per MD Dr. Vaughan Browner. Vitals are stable. RT will continue to monitor.

## 2017-12-05 NOTE — Progress Notes (Addendum)
NAME:  [name], MRN:  [id number], DOB:  [birth year], LOS: 4 ADMISSION DATE:  12/09/2017, CONSULTATION DATE:  12/15/2017 REFERRING MD:  EDP - Plunkit, CHIEF COMPLAINT:  Respiratory failure and septic shock   Brief History   84 year old female with CVA who presents to the hospital with altered mental status. Intubated in ED. Started on Levophed for sepsis, pneumonia.  Extubated 11/5 but had to be reintubated the following day for worsening respiratory distress, delirium.  Past Medical History  CVA with left sided paralysis  Significant Hospital Events   11/3 Intubation for PNA 11/5 Extuabted 11/6 Retintubated, ARDS  Consults: date of consult/date signed off & final recs:  PCCM  Procedures (surgical and bedside):  ETT 11/3>>> 11/5, 11/7 R femoral TLC 11/3>>>  R radial a-line 11/3>>> 11/5  Significant Diagnostic Tests:  CT with old right sided CVA  Micro Data:  Blood 11/3>>> Resp 11/3>>> Normal resp flora Urine 11/3>>>  Antimicrobials:  Vanc 11/3>>> 11/5 Zosyn 11/3>>> 11/5 Cefepime 11/6 >> Flagyl 11/6 >>  Subjective:  Reintubated for worsening respiratory distress Remains on the vent, agitated  Objective   Blood pressure (!) 87/57, pulse 88, temperature 98.2 F (36.8 C), resp. rate (!) 27, height 5\' 3"  (1.6 m), weight 70.7 kg, SpO2 96 %.    Vent Mode: PRVC FiO2 (%):  [40 %-60 %] 50 % Set Rate:  [16 bmp-20 bmp] 20 bmp Vt Set:  [410 mL] 410 mL PEEP:  [8 cmH20] 8 cmH20 Plateau Pressure:  [22 cmH20-26 cmH20] 22 cmH20   Intake/Output Summary (Last 24 hours) at 12/05/2017 0921 Last data filed at 12/05/2017 0836 Gross per 24 hour  Intake 4935.62 ml  Output 2195 ml  Net 2740.62 ml   Filed Weights   12/03/17 0410 12/04/17 0437 12/05/17 0405  Weight: 72.6 kg 70.2 kg 70.7 kg    Examination: Gen:      Frail, elderly HEENT:  EOMI, sclera anicteric Neck:     No masses; no thyromegaly ET tube Lungs:    Clear to auscultation bilaterally; normal respiratory  effort CV:         Regular rate and rhythm; no murmurs Abd:      + bowel sounds; soft, non-tender; no palpable masses, no distension Ext:    No edema; adequate peripheral perfusion Skin:      Warm and dry; no rash Neuro: Sedated, moves all extremities.  No focal deficits.  Resolved Hospital Problem list   Septic shock  Assessment & Plan:  84 year old female with LLL PNA and septic shock, concern for aspiration, respiratory failure.   Vent dependent respiratory failure Left lower lobe pneumonia, aspiration ARDS. P/F ratio 200 Continue vent support Reduce tidal volume to 6 cc/kg Continue Solu-Medrol, nebs On cefepime, Flagyl. Follow chest x-ray  Elevated troponin, likely demand > trending down Follow troponin Hold Lasix as blood pressure is soft.  Old CVA noted on CT head NPO, chronic PEG tube for feeds Neuro monitoring  Hypokalemia, low phos Replete lytes, replete phos  Hyperglycemia with steroids Start lantus 5 units Continue SSI  Goals of care Discussed with son yesterday before intubation He would like to support her for short-term with intubation.  If he does not turnaround then he will discuss with his sister in [place] about transitioning to comfort In the meantime he is CODE STATUS changed to DNR.  Disposition / Summary of Today's Plan 12/05/17   Continue vent support.  Reduce tidal volume to 6 cc/kg  Diet: N.p.o. Pain/Anxiety/Delirium protocol (if indicated): Precedex, fentanyl drip. Rass goal 0 VAP protocol (if indicated): Yes DVT prophylaxis: Heparin subcu GI prophylaxis: Pepcid Hyperglycemia protocol:  Mobility: Bed Code Status: DNR Family Communication: Son updated over the phone 11/7.  The patient is critically ill with multiple organ system failure and requires high complexity decision making for assessment and support, frequent evaluation and titration of therapies, advanced monitoring, review of radiographic studies and interpretation of  complex data.   Critical Care Time devoted to patient care services, exclusive of separately billable procedures, described in this note is 35 minutes.   Marshell Garfinkel MD [facility] Pager [phone] If no answer or after 3pm call: [phone] 12/05/2017, 9:30 AM

## 2017-12-05 NOTE — Progress Notes (Signed)
Nutrition Follow-up  DOCUMENTATION CODES:   Not applicable  INTERVENTION:   When able to resume TF via PEG:  Continue Jevity 1.2 at 50 ml/h  Increase Pro-stat to 30 ml BID  Provides 1640 kcal, 97 gm protein, 972 ml free water daily  NUTRITION DIAGNOSIS:   Inadequate oral intake related to inability to eat as evidenced by NPO status.  Ongoing  GOAL:   Patient will meet greater than or equal to 90% of their needs  Met with TF  MONITOR:   TF tolerance, Labs, I & O's, Weight trends  ASSESSMENT:   [name] is an 84 yo female with PMH CVA left sided paralysis, who presents to the hospital via EMS after being found unresponsive by son. Pt intubated 11/3 in ED due to inability to protect her airway.   Patient required re-intubation yesterday. Current TF order: Jevity via PEG at 50 ml/h (1200 ml/day) with Prostat 30 ml once daily to provide 1540 kcals, 82 gm protein, 972 ml free water daily.  TF currently held due to leaking at [facility] site. Abdominal x ray pending.  Patient is currently intubated on ventilator support MV: 13.5 L/min Temp (24hrs), Avg:97.7 F (36.5 C), Min:95.4 F (35.2 C), Max:100.2 F (37.9 C)   Labs reviewed. Phosphorus < 1 (L)  CBG's: 208-190 Medications reviewed and include Novolog, Lantus, Solu-medrol, Levophed, Sodium phosphate.    Diet Order:   Diet Order            Diet NPO time specified  Diet effective now              EDUCATION NEEDS:   No education needs have been identified at this time  Skin:  Skin Assessment: Reviewed RN Assessment  Last BM:  11/7  Height:   Ht Readings from Last 1 Encounters:  12/15/2017 _0  (1.6 m)    Weight:   Wt Readings from Last 1 Encounters:  12/05/17 70.7 kg    Ideal Body Weight:  52.3 kg  BMI:  Body mass index is 27.61 kg/m.  Estimated Nutritional Needs:   Kcal:  1615  Protein:  80-100 gm  Fluid:  >/= 1.6 L    Molli Barrows, RD, LDN, [place] Pager [phone] After Hours  Pager [phone]

## 2017-12-05 NOTE — Progress Notes (Signed)
Peripherally Inserted Central Catheter/Midline Placement  The IV Nurse has discussed with the patient and/or persons authorized to consent for the patient, the purpose of this procedure and the potential benefits and risks involved with this procedure.  The benefits include less needle sticks, lab draws from the catheter, and the patient may be discharged home with the catheter. Risks include, but not limited to, infection, bleeding, blood clot (thrombus formation), and puncture of an artery; nerve damage and irregular heartbeat and possibility to perform a PICC exchange if needed/ordered by physician.  Alternatives to this procedure were also discussed.  B[employer] PICC patient education guide, fact sheet on infection prevention and patient information card has been provided to patient /or left at bedside.    PICC/Midline Placement Documentation    Consent obtained via telephone with son Adonica, [place] 12/05/2017, 12:58 PM

## 2017-12-05 NOTE — Progress Notes (Signed)
Responded to unit page to support family at bedside. Son request priest visit. Chaplain provided information to son per his request to assist him with his mother and their need.  Patient is aligned with church but not registered. Son is going to call church representative who has been working with family.  Provided empathetic listening, information sharing and presence.  Will follow as needed.   Jaclynn Major, [place], [facility], Pager [phone]

## 2017-12-05 NOTE — Care Management Note (Signed)
Case Management Note  Patient Details  Name: [name] MRN: [id number] Date of Birth: 1930/10/01  Subjective/Objective:   Pt admitted with AMS secondary to PNA and Septic Shock                 Action/Plan:   PTA from home.     Expected Discharge Date:                  Expected Discharge Plan:     In-House Referral:  Clinical Social Work  Discharge planning Services  CM Consult  Post Acute Care Choice:    Choice offered to:     DME Arranged:    DME Agency:     HH Arranged:    HH Agency:     Status of Service:     If discussed at [employer], dates discussed:    Additional Comments: 12/05/2017 Pt extubated 11/5 but re intubated 11/6 due to ARDS.   Maryclare Labrador, RN 12/05/2017, 4:08 PM

## 2017-12-06 ENCOUNTER — Inpatient Hospital Stay (HOSPITAL_COMMUNITY): Payer: Medicare Other

## 2017-12-06 LAB — POCT I-STAT 3, ART BLOOD GAS (G3+)
Bicarbonate: 26.9 mmol/L (ref 20.0–28.0)
O2 SAT: 94 %
PO2 ART: 72 mmHg — AB (ref 83.0–108.0)
TCO2: 29 mmol/L (ref 22–32)
pCO2 arterial: 51.2 mmHg — ABNORMAL HIGH (ref 32.0–48.0)
pH, Arterial: 7.324 — ABNORMAL LOW (ref 7.350–7.450)

## 2017-12-06 LAB — GLUCOSE, CAPILLARY
GLUCOSE-CAPILLARY: 190 mg/dL — AB (ref 70–99)
GLUCOSE-CAPILLARY: 142 mg/dL — AB (ref 70–99)
Glucose-Capillary: 156 mg/dL — ABNORMAL HIGH (ref 70–99)
Glucose-Capillary: 162 mg/dL — ABNORMAL HIGH (ref 70–99)
Glucose-Capillary: 166 mg/dL — ABNORMAL HIGH (ref 70–99)
Glucose-Capillary: 169 mg/dL — ABNORMAL HIGH (ref 70–99)

## 2017-12-06 LAB — CBC
HEMATOCRIT: 36.8 % (ref 36.0–46.0)
HEMOGLOBIN: 10.9 g/dL — AB (ref 12.0–15.0)
MCH: 27.5 pg (ref 26.0–34.0)
MCHC: 29.6 g/dL — AB (ref 30.0–36.0)
MCV: 92.7 fL (ref 80.0–100.0)
Platelets: 253 10*3/uL (ref 150–400)
RBC: 3.97 MIL/uL (ref 3.87–5.11)
RDW: 14.7 % (ref 11.5–15.5)
WBC: 26 10*3/uL — AB (ref 4.0–10.5)
nRBC: 0.1 % (ref 0.0–0.2)

## 2017-12-06 LAB — CULTURE, BLOOD (ROUTINE X 2)
Culture: NO GROWTH
Culture: NO GROWTH
Special Requests: ADEQUATE

## 2017-12-06 LAB — MAGNESIUM: MAGNESIUM: 1.9 mg/dL (ref 1.7–2.4)

## 2017-12-06 LAB — BASIC METABOLIC PANEL
Anion gap: 9 (ref 5–15)
BUN: 29 mg/dL — ABNORMAL HIGH (ref 8–23)
CHLORIDE: 113 mmol/L — AB (ref 98–111)
CO2: 25 mmol/L (ref 22–32)
CREATININE: 0.85 mg/dL (ref 0.44–1.00)
Calcium: 7.5 mg/dL — ABNORMAL LOW (ref 8.9–10.3)
GFR calc non Af Amer: >60 mL/min (ref >60)
Glucose, Bld: 172 mg/dL — ABNORMAL HIGH (ref 70–99)
Potassium: 3.8 mmol/L (ref 3.5–5.1)
Sodium: 147 mmol/L — ABNORMAL HIGH (ref 135–145)

## 2017-12-06 LAB — TROPONIN I: TROPONIN I: 1.16 ng/mL — AB (ref <0.03)

## 2017-12-06 LAB — PROCALCITONIN: PROCALCITONIN: 2.15 ng/mL

## 2017-12-06 LAB — PHOSPHORUS: PHOSPHORUS: 2.7 mg/dL (ref 2.5–4.6)

## 2017-12-06 MED ORDER — METHYLPREDNISOLONE SODIUM SUCC 125 MG IJ SOLR
40.0000 mg | Freq: Three times a day (TID) | INTRAMUSCULAR | Status: DC
  Administered 2017-12-06 – 2017-12-07 (×3): 40 mg via INTRAVENOUS
  Filled 2017-12-06 (×3): qty 2

## 2017-12-06 MED ORDER — CALCIUM GLUCONATE-NACL 1-0.675 GM/50ML-% IV SOLN
1.0000 g | Freq: Once | INTRAVENOUS | Status: AC
  Administered 2017-12-06: 1000 mg via INTRAVENOUS
  Filled 2017-12-06: qty 50

## 2017-12-06 NOTE — Progress Notes (Signed)
NAME:  [name], MRN:  [id number], DOB:  1930-11-05, LOS: 5 ADMISSION DATE:  12/07/2017, CONSULTATION DATE:  12/09/2017 REFERRING MD:  EDP - Plunkit, CHIEF COMPLAINT:  Respiratory failure and septic shock   Brief History   84 year old female with CVA who presents to the hospital with altered mental status. Intubated in ED. Started on Levophed for sepsis, pneumonia.  Extubated 11/5 but had to be reintubated the following day for worsening respiratory distress, delirium.  Past Medical History  CVA with left sided paralysis  Significant Hospital Events   11/3 Intubation for PNA 11/5 Extuabted 11/6 Retintubated, ARDS  Consults: date of consult/date signed off & final recs:  PCCM  Procedures (surgical and bedside):  ETT 11/3>>> 11/5, 11/7 R femoral TLC 11/3>>> 11/7 R radial a-line 11/3>>> 11/5 Rt PICC 11/7 >>  Significant Diagnostic Tests:  CT with old right sided CVA  Micro Data:  Blood 11/3>>> Resp 11/3>>> Normal resp flora Urine 11/3>>>  Antimicrobials:  Vanc 11/3>>> 11/5 Zosyn 11/3>>> 11/5 Cefepime 11/6 >> Flagyl 11/6 >>  Subjective:  Remains on the vent Stable overnight Sedated, unresponsive.  Objective   Blood pressure 140/70, pulse (!) 117, temperature 99.5 F (37.5 C), resp. rate 19, height 5\' 3"  (1.6 m), weight 72.4 kg, SpO2 93 %.    Vent Mode: PRVC FiO2 (%):  [50 %] 50 % Set Rate:  [22 bmp] 22 bmp Vt Set:  [310 mL] 310 mL PEEP:  [8 cmH20] 8 cmH20 Plateau Pressure:  [16 cmH20-24 cmH20] 17 cmH20   Intake/Output Summary (Last 24 hours) at 12/06/2017 0855 Last data filed at 12/06/2017 0846 Gross per 24 hour  Intake 3131.09 ml  Output 795 ml  Net 2336.09 ml   Filed Weights   12/04/17 0437 12/05/17 0405 12/06/17 0442  Weight: 70.2 kg 70.7 kg 72.4 kg    Examination: Gen:      No acute distress HEENT:  EOMI, sclera anicteric, ET tube Neck:     No masses; no thyromegaly Lungs:    Clear to auscultation bilaterally; normal respiratory  effort CV:         Regular rate and rhythm; no murmurs Abd:      + bowel sounds; soft, non-tender; no palpable masses, no distension Ext:    2+ edema; adequate peripheral perfusion Skin:      Warm and dry; no rash Neuro: Sedated, unresponsive.  Resolved Hospital Problem list   Septic shock  Assessment & Plan:  84 year old female with LLL PNA and septic shock, concern for aspiration, respiratory failure.   Vent dependent respiratory failure Left lower lobe pneumonia, aspiration ARDS. P/F ratio 200 Continue vent support 6 cc/kg tidal volume Continue Solu-Medrol, nebs On cefepime, Flagyl. Follow procalcitonin to determine length of therapy. Follow chest x-ray  Elevated troponin, likely demand.  EKG does not show ischemia No wall motion abnormalities on echo. Follow troponin Holding Lasix due to elevated lactic acid, soft blood pressures.  Old CVA noted on CT head NPO, chronic PEG tube for feeds Neuro monitoring  Hypokalemia, low phos Replete lytes, replete phos  Hyperglycemia with steroids Continue Lantus, SSI  Goals of care Discussed with son yesterday before intubation He would like to support her for short-term with intubation.  If he does not turnaround then he will discuss with his sister in [place] about transitioning to comfort Current CODE STATUS is a DNR.  Disposition / Summary of Today's Plan 12/06/17   Continue vent support.  Reduce tidal volume to 6  cc/kg    Diet: N.p.o. Pain/Anxiety/Delirium protocol (if indicated): Fentanyl drip. Rass goal 0 VAP protocol (if indicated): Yes DVT prophylaxis: Heparin subcu GI prophylaxis: Pepcid Hyperglycemia protocol:  Mobility: Bed Code Status: DNR Family Communication: Son updated 11/7.  No family at bedside 9/8  The patient is critically ill with multiple organ system failure and requires high complexity decision making for assessment and support, frequent evaluation and titration of therapies, advanced  monitoring, review of radiographic studies and interpretation of complex data.   Critical Care Time devoted to patient care services, exclusive of separately billable procedures, described in this note is 35 minutes.   Marshell Garfinkel MD [place] [facility] Pager [phone] If no answer or after 3pm call: [phone] 12/06/2017, 9:05 AM

## 2017-12-06 NOTE — Progress Notes (Signed)
Pharmacy Antibiotic Note  [name] is a 84 y.o. female admitted on 12/19/2017 with pneumonia, now with possible ARDS.  Pharmacy has been consulted for cefepime and metronidazole. No growth from cultures, tracheal aspirate with normal respiratory flora. WBC 8.4>>26.0, afebrile, LA 4.7>>2.7  Plan: Continue cefepime 1g IV Q24h Continue Flagyl 500mg  IV Q8h Monitor clinical picture, renal function Trending procal per MD to assess de-escalation   Height: 5\' 3"  (160 cm) Weight: 159 lb 9.8 oz (72.4 kg) IBW/kg (Calculated) : 52.4  Temp (24hrs), Avg:98.7 F (37.1 C), Min:97 F (36.1 C), Max:99.5 F (37.5 C)  Recent Labs  Lab 12/18/2017 1233 12/17/2017 1435 12/02/17 0513 12/03/17 0418 12/04/17 0732 12/04/17 1007 12/04/17 1631 12/05/17 0332 12/05/17 0821 12/05/17 1108 12/05/17 1554 12/06/17 0408  WBC  --   --  5.7 7.6  --  15.5*  --  8.4  --   --   --  26.0*  CREATININE  --   --  0.95 0.97 1.08*  --  1.06* 1.15*  --   --   --  0.85  LATICACIDVEN 4.56* 3.35*  --   --   --   --   --   --  4.7* 4.3* 2.7*  --     Estimated Creatinine Clearance: 46.1 mL/min (by C-G formula based on SCr of 0.85 mg/dL).    Allergies  Allergen Reactions  . Zosyn [Piperacillin Sod-Tazobactam So] Rash    Rash on chest, abdomen and face.    CTX 11/3 >> 11/3 Azith 11/3 >> 11/3 Vanc 11/3 >> 11/5 Zosyn 11/3 >>11/5 Cefepime 11/5 >> Flagyl 11/5 >>  11/3 UCx - ngtd 11/3 BCx - ngtd 11/3 Trach aspirate - few GPRs, rare GNR (normal resp. flora) MRSA PCR negative  Thank you for allowing pharmacy to be a part of this patient's care.  Janae Bridgeman, PharmD PGY1 Pharmacy Resident Phone: [phone] 12/06/2017 9:01 AM

## 2017-12-06 NOTE — Progress Notes (Signed)
Review CXR from 12/06/17 and ECG tracing for PICC tip placement.  IV team is ok with current placement as per our policy

## 2017-12-07 ENCOUNTER — Inpatient Hospital Stay (HOSPITAL_COMMUNITY): Payer: Medicare Other

## 2017-12-07 ENCOUNTER — Other Ambulatory Visit: Payer: Self-pay

## 2017-12-07 ENCOUNTER — Encounter (HOSPITAL_COMMUNITY): Payer: Self-pay

## 2017-12-07 LAB — CBC
HEMATOCRIT: 35.5 % — AB (ref 36.0–46.0)
Hemoglobin: 10.2 g/dL — ABNORMAL LOW (ref 12.0–15.0)
MCH: 27.5 pg (ref 26.0–34.0)
MCHC: 28.7 g/dL — ABNORMAL LOW (ref 30.0–36.0)
MCV: 95.7 fL (ref 80.0–100.0)
NRBC: 0.4 % — AB (ref 0.0–0.2)
PLATELETS: 216 10*3/uL (ref 150–400)
RBC: 3.71 MIL/uL — AB (ref 3.87–5.11)
RDW: 15.2 % (ref 11.5–15.5)
WBC: 17.1 10*3/uL — ABNORMAL HIGH (ref 4.0–10.5)

## 2017-12-07 LAB — GLUCOSE, CAPILLARY
GLUCOSE-CAPILLARY: 174 mg/dL — AB (ref 70–99)
GLUCOSE-CAPILLARY: 150 mg/dL — AB (ref 70–99)
Glucose-Capillary: 151 mg/dL — ABNORMAL HIGH (ref 70–99)
Glucose-Capillary: 229 mg/dL — ABNORMAL HIGH (ref 70–99)
Glucose-Capillary: 222 mg/dL — ABNORMAL HIGH (ref 70–99)
Glucose-Capillary: 156 mg/dL — ABNORMAL HIGH (ref 70–99)

## 2017-12-07 LAB — TROPONIN I: TROPONIN I: 0.63 ng/mL — AB (ref <0.03)

## 2017-12-07 LAB — BASIC METABOLIC PANEL
ANION GAP: 6 (ref 5–15)
BUN: 33 mg/dL — AB (ref 8–23)
CO2: 28 mmol/L (ref 22–32)
Calcium: 7.7 mg/dL — ABNORMAL LOW (ref 8.9–10.3)
Chloride: 116 mmol/L — ABNORMAL HIGH (ref 98–111)
Creatinine, Ser: 0.83 mg/dL (ref 0.44–1.00)
GFR calc Af Amer: >60 mL/min (ref >60)
GFR calc non Af Amer: >60 mL/min (ref >60)
Glucose, Bld: 200 mg/dL — ABNORMAL HIGH (ref 70–99)
POTASSIUM: 4.1 mmol/L (ref 3.5–5.1)
Sodium: 150 mmol/L — ABNORMAL HIGH (ref 135–145)

## 2017-12-07 LAB — PROCALCITONIN: PROCALCITONIN: 1.36 ng/mL

## 2017-12-07 LAB — MAGNESIUM: MAGNESIUM: 2.1 mg/dL (ref 1.7–2.4)

## 2017-12-07 LAB — PHOSPHORUS: Phosphorus: 1.9 mg/dL — ABNORMAL LOW (ref 2.5–4.6)

## 2017-12-07 LAB — LACTIC ACID, PLASMA: Lactic Acid, Venous: 2.5 mmol/L (ref 0.5–1.9)

## 2017-12-07 MED ORDER — FREE WATER
350.0000 mL | Freq: Three times a day (TID) | Status: DC
  Administered 2017-12-07 – 2017-12-08 (×4): 350 mL

## 2017-12-07 MED ORDER — METHYLPREDNISOLONE SODIUM SUCC 125 MG IJ SOLR
40.0000 mg | Freq: Two times a day (BID) | INTRAMUSCULAR | Status: DC
  Administered 2017-12-07 – 2017-12-08 (×2): 40 mg via INTRAVENOUS
  Filled 2017-12-07 (×2): qty 2

## 2017-12-07 MED ORDER — POTASSIUM PHOSPHATES 15 MMOLE/5ML IV SOLN
30.0000 mmol | Freq: Once | INTRAVENOUS | Status: AC
  Administered 2017-12-07: 30 mmol via INTRAVENOUS
  Filled 2017-12-07: qty 10

## 2017-12-07 MED ORDER — FUROSEMIDE 10 MG/ML IJ SOLN
40.0000 mg | Freq: Two times a day (BID) | INTRAMUSCULAR | Status: DC
  Administered 2017-12-07 – 2017-12-10 (×8): 40 mg via INTRAVENOUS
  Filled 2017-12-07 (×8): qty 4

## 2017-12-07 NOTE — Progress Notes (Signed)
NAME:  [name], MRN:  [id number], DOB:  Sep 10, 1930, LOS: 6 ADMISSION DATE:  12/08/2017, CONSULTATION DATE:  12/03/2017 REFERRING MD:  EDP - Plunkit, CHIEF COMPLAINT:  Respiratory failure and septic shock   Brief History   84 year old female with CVA who presents to the hospital with altered mental status. Intubated in ED. Started on Levophed for sepsis, pneumonia.  Extubated 11/5 but had to be reintubated the following day for worsening respiratory distress, delirium.  Past Medical History  CVA with left sided paralysis  Significant Hospital Events   11/3 Intubation for PNA 11/5 Extuabted 11/6 Retintubated, ARDS  Consults: date of consult/date signed off & final recs:  PCCM  Procedures (surgical and bedside):  ETT 11/3>>> 11/5, 11/7 >> R femoral TLC 11/3>>> 11/7 R radial a-line 11/3>>> 11/5 Rt PICC 11/7 >>  Significant Diagnostic Tests:  CT head 11/3-old right sided CVA Chest x-ray 12/07/2017- ET tube in good position, diffuse bilateral opacities which are unchanged.  I have reviewed the images personally.  Micro Data:  Blood 11/3>>> Resp 11/3>>> Normal resp flora Urine 11/3>>>  Antimicrobials:  Vanc 11/3>>> 11/5 Zosyn 11/3>>> 11/5 Cefepime 11/6 >> Flagyl 11/6 >>  Subjective:  Remains on the vent Stable overnight Sedated, unresponsive.  Objective   Blood pressure 135/69, pulse (!) 122, temperature (!) 100.6 F (38.1 C), temperature source Axillary, resp. rate 16, height 5\' 3"  (1.6 m), weight 73.7 kg, SpO2 96 %. CVP:  [14 mmHg] 14 mmHg  Vent Mode: PRVC FiO2 (%):  [40 %-50 %] 40 % Set Rate:  [22 bmp] 22 bmp Vt Set:  [310 mL] 310 mL PEEP:  [8 cmH20] 8 cmH20 Plateau Pressure:  [21 cmH20-23 cmH20] 22 cmH20   Intake/Output Summary (Last 24 hours) at 12/07/2017 0755 Last data filed at 12/07/2017 0700 Gross per 24 hour  Intake 1179.56 ml  Output 650 ml  Net 529.56 ml   Filed Weights   12/05/17 0405 12/06/17 0442 12/07/17 0426  Weight: 70.7 kg 72.4 kg  73.7 kg    Examination: Gen:      No acute distress HEENT:  EOMI, sclera anicteric ET tube Neck:     No masses; no thyromegaly Lungs:    Coarse breath sounds CV:         Regular rate and rhythm; no murmurs Abd:      + bowel sounds; soft, non-tender; no palpable masses, no distension Ext:    1-2+   edema; adequate peripheral perfusion Skin:      Warm and dry; no rash Neuro: Sedated, unresponsive.   Resolved Hospital Problem list   Septic shock  Assessment & Plan:  84 year old female with LLL PNA and septic shock, concern for aspiration, respiratory failure.   Vent dependent respiratory failure Left lower lobe pneumonia, aspiration ARDS. P/F ratio 200 Continue vent support 6 cc/kg tidal volume Reduce Solu-Medrol to 40 mg every 12 On cefepime, Flagyl.  Plan on 7 days of therapy Follow procalcitonin  Elevated troponin, likely demand.  EKG does not show ischemia No wall motion abnormalities on echo. Troponin is trending down. Resume Lasix 40 mg IV every 12.  Old CVA noted on CT head NPO, chronic PEG tube for feeds Neuro monitoring  Hypernatremia Hypokalemia, low phos Replete phosphorus Start free water for elevated sodium.  Hyperglycemia with steroids Continue Lantus, SSI  Goals of care Discussed with 11/7 before intubation He would like to support her for short-term with intubation.  If he does not turnaround then he  will discuss with his sister in [place] about transitioning to comfort Current CODE STATUS is a DNR.  Disposition / Summary of Today's Plan 12/07/17   Given support.  Start weaning.    Diet: N.p.o. Pain/Anxiety/Delirium protocol (if indicated): Fentanyl drip. Rass goal 0 VAP protocol (if indicated): Yes DVT prophylaxis: Heparin subcu GI prophylaxis: Pepcid Hyperglycemia protocol:  Mobility: Bed Code Status: DNR Family Communication: Son updated 11/7.  No family at bedside 11/9  The patient is critically ill with multiple organ system  failure and requires high complexity decision making for assessment and support, frequent evaluation and titration of therapies, advanced monitoring, review of radiographic studies and interpretation of complex data.   Critical Care Time devoted to patient care services, exclusive of separately billable procedures, described in this note is 40 minutes.   Marshell Garfinkel MD  Pulmonary and Critical Care Pager [phone] If no answer or after 3pm call: [phone] 12/07/2017, 8:01 AM

## 2017-12-07 NOTE — Progress Notes (Signed)
RT note-Recruitment maneuver performed, right BS, very decreaed with rhonchi, with not much sputum production. MD aware.

## 2017-12-08 ENCOUNTER — Inpatient Hospital Stay (HOSPITAL_COMMUNITY): Payer: Medicare Other

## 2017-12-08 ENCOUNTER — Encounter (HOSPITAL_COMMUNITY): Payer: Self-pay

## 2017-12-08 LAB — BASIC METABOLIC PANEL
ANION GAP: 7 (ref 5–15)
BUN: 40 mg/dL — AB (ref 8–23)
CALCIUM: 7.9 mg/dL — AB (ref 8.9–10.3)
CO2: 33 mmol/L — AB (ref 22–32)
CREATININE: 0.85 mg/dL (ref 0.44–1.00)
Chloride: 107 mmol/L (ref 98–111)
GFR calc Af Amer: >60 mL/min (ref >60)
Glucose, Bld: 168 mg/dL — ABNORMAL HIGH (ref 70–99)
Potassium: 3.9 mmol/L (ref 3.5–5.1)
Sodium: 147 mmol/L — ABNORMAL HIGH (ref 135–145)

## 2017-12-08 LAB — GLUCOSE, CAPILLARY
GLUCOSE-CAPILLARY: 148 mg/dL — AB (ref 70–99)
GLUCOSE-CAPILLARY: 169 mg/dL — AB (ref 70–99)
Glucose-Capillary: 181 mg/dL — ABNORMAL HIGH (ref 70–99)

## 2017-12-08 LAB — CBC
HCT: 36 % (ref 36.0–46.0)
Hemoglobin: 10.5 g/dL — ABNORMAL LOW (ref 12.0–15.0)
MCH: 27.3 pg (ref 26.0–34.0)
MCHC: 29.2 g/dL — AB (ref 30.0–36.0)
MCV: 93.5 fL (ref 80.0–100.0)
NRBC: 0.3 % — AB (ref 0.0–0.2)
PLATELETS: 207 10*3/uL (ref 150–400)
RBC: 3.85 MIL/uL — ABNORMAL LOW (ref 3.87–5.11)
RDW: 15.1 % (ref 11.5–15.5)
WBC: 18 10*3/uL — ABNORMAL HIGH (ref 4.0–10.5)

## 2017-12-08 LAB — POCT I-STAT 3, ART BLOOD GAS (G3+)
ACID-BASE EXCESS: 11 mmol/L — AB (ref 0.0–2.0)
BICARBONATE: 37.8 mmol/L — AB (ref 20.0–28.0)
O2 Saturation: 98 %
PH ART: 7.384 (ref 7.350–7.450)
TCO2: 40 mmol/L — ABNORMAL HIGH (ref 22–32)
pCO2 arterial: 63.7 mmHg — ABNORMAL HIGH (ref 32.0–48.0)
pO2, Arterial: 123 mmHg — ABNORMAL HIGH (ref 83.0–108.0)

## 2017-12-08 LAB — MAGNESIUM: Magnesium: 2 mg/dL (ref 1.7–2.4)

## 2017-12-08 LAB — PROCALCITONIN: Procalcitonin: 0.93 ng/mL

## 2017-12-08 LAB — PHOSPHORUS: Phosphorus: 2.5 mg/dL (ref 2.5–4.6)

## 2017-12-08 MED ORDER — MORPHINE BOLUS VIA INFUSION
5.0000 mg | INTRAVENOUS | Status: DC | PRN
  Administered 2017-12-08 – 2017-12-11 (×2): 5 mg via INTRAVENOUS
  Filled 2017-12-08: qty 5

## 2017-12-08 MED ORDER — DIPHENHYDRAMINE HCL 50 MG/ML IJ SOLN: 25.0000 mg | INTRAMUSCULAR | Status: DC | PRN

## 2017-12-08 MED ORDER — DEXTROSE 5 % IV SOLN
INTRAVENOUS | Status: DC
  Administered 2017-12-08 – 2017-12-10 (×3): via INTRAVENOUS

## 2017-12-08 MED ORDER — ACETAMINOPHEN 650 MG RE SUPP: 650.0000 mg | Freq: Four times a day (QID) | RECTAL | Status: DC | PRN

## 2017-12-08 MED ORDER — GLYCOPYRROLATE 0.2 MG/ML IJ SOLN: 0.2000 mg | INTRAMUSCULAR | Status: DC | PRN

## 2017-12-08 MED ORDER — GLYCOPYRROLATE 1 MG PO TABS
1.0000 mg | ORAL_TABLET | ORAL | Status: DC | PRN
  Filled 2017-12-08: qty 1

## 2017-12-08 MED ORDER — FAMOTIDINE 40 MG/5ML PO SUSR
20.0000 mg | Freq: Every day | ORAL | Status: DC
  Administered 2017-12-08: 20 mg
  Filled 2017-12-08: qty 2.5

## 2017-12-08 MED ORDER — MORPHINE SULFATE (PF) 2 MG/ML IV SOLN
2.0000 mg | INTRAVENOUS | Status: DC | PRN
  Administered 2017-12-11 – 2017-12-12 (×2): 2 mg via INTRAVENOUS
  Filled 2017-12-08 (×2): qty 1

## 2017-12-08 MED ORDER — ACETAMINOPHEN 325 MG PO TABS: 650.0000 mg | ORAL_TABLET | Freq: Four times a day (QID) | ORAL | Status: DC | PRN

## 2017-12-08 MED ORDER — POLYVINYL ALCOHOL 1.4 % OP SOLN: 1.0000 [drp] | Freq: Four times a day (QID) | OPHTHALMIC | Status: DC | PRN

## 2017-12-08 MED ORDER — MORPHINE 100MG IN NS 100ML (1MG/ML) PREMIX INFUSION
0.0000 mg/h | INTRAVENOUS | Status: DC
  Administered 2017-12-08: 10 mg/h via INTRAVENOUS
  Administered 2017-12-09: 15 mg/h via INTRAVENOUS
  Administered 2017-12-09 (×2): 18 mg/h via INTRAVENOUS
  Administered 2017-12-09: 10 mg/h via INTRAVENOUS
  Administered 2017-12-10 (×4): 18 mg/h via INTRAVENOUS
  Administered 2017-12-11 (×2): 20 mg/h via INTRAVENOUS
  Administered 2017-12-11: 18 mg/h via INTRAVENOUS
  Administered 2017-12-11 – 2017-12-12 (×3): 20 mg/h via INTRAVENOUS
  Filled 2017-12-08 (×19): qty 100

## 2017-12-08 NOTE — Progress Notes (Signed)
[place] Progress Note Patient Name: [name] DOB: May 12, 1930 MRN: [id number]   Date of Service  12/08/2017  HPI/Events of Note  ABG on 40%/PRVC 22/TV 310/P 8 = 7.38/63.7/123.0. His pH is normal.   eICU Interventions  Continue present ventilator management. Wean FiO2 as tolerated.      Intervention Category Major Interventions: Acid-Base disturbance - evaluation and management;Respiratory failure - evaluation and management  Sommer,Steven Cornelia Copa 12/08/2017, 4:54 AM

## 2017-12-08 NOTE — Progress Notes (Signed)
PCCM interval note  Discussed with son.  He had a chance to talk to his sister and they both agree their mother would not want to be supported like this They have requested transition to comfort care and terminal withdrawal Orders placed  Marshell Garfinkel MD [place] [facility] 12/08/2017, 3:10 PM

## 2017-12-08 NOTE — Progress Notes (Signed)
NAME:  [name], MRN:  [id number], DOB:  29-Mar-1930, LOS: 7 ADMISSION DATE:  12/23/2017, CONSULTATION DATE:  12/15/2017 REFERRING MD:  EDP - Plunkit, CHIEF COMPLAINT:  Respiratory failure and septic shock   Brief History   84 year old female with CVA who presents to the hospital with altered mental status. Intubated in ED. Started on Levophed for sepsis, pneumonia. Extubated 11/5 but had to be reintubated the following day for worsening respiratory distress, delirium.  Past Medical History  CVA with left sided paralysis  Significant Hospital Events   11/3 Intubation for PNA 11/5 Extuabted 11/6 Retintubated, ARDS  Consults: date of consult/date signed off & final recs:  PCCM  Procedures (surgical and bedside):  ETT 11/3>>> 11/5, 11/7 >> R femoral TLC 11/3>>> 11/7 R radial a-line 11/3>>> 11/5 Rt PICC 11/7 >>  Significant Diagnostic Tests:  CT head 11/3-old right sided CVA Chest x-ray 12/07/2017- ET tube in good position, diffuse bilateral opacities which are unchanged.  I have reviewed the images personally.  Micro Data:  Blood 11/3>>> Resp 11/3>>> Normal resp flora Urine 11/3>>>  Antimicrobials:  Vanc 11/3>>> 11/5 Zosyn 11/3>>> 11/5 Cefepime 11/6 >> Flagyl 11/6 >>  Subjective:  Remains on the vent Stable overnight Sedated, unresponsive.  Objective   Blood pressure 139/80, pulse (!) 121, temperature (!) 96.8 F (36 C), temperature source Axillary, resp. rate 18, height 5\' 3"  (1.6 m), weight 72.4 kg, SpO2 94 %.    Vent Mode: PRVC FiO2 (%):  [40 %] 40 % Set Rate:  [22 bmp] 22 bmp Vt Set:  [310 mL] 310 mL PEEP:  [8 cmH20] 8 cmH20 Plateau Pressure:  [19 cmH20-20 cmH20] 19 cmH20   Intake/Output Summary (Last 24 hours) at 12/08/2017 0855 Last data filed at 12/08/2017 0615 Gross per 24 hour  Intake 5850.46 ml  Output 3825 ml  Net 2025.46 ml   Filed Weights   12/06/17 0442 12/07/17 0426 12/08/17 0419  Weight: 72.4 kg 73.7 kg 72.4 kg     Examination: Gen:      No acute distress HEENT:  EOMI, sclera anicteric ET tube Neck:     No masses; no thyromegaly Lungs:    Coarse breath sounds CV:         Regular rate and rhythm; no murmurs Abd:      + bowel sounds; soft, non-tender; no palpable masses, no distension Ext:    1-2+   edema; adequate peripheral perfusion Skin:      Warm and dry; no rash Neuro: Sedated, unresponsive.  Resolved Hospital Problem list   Septic shock  Assessment & Plan:  84 year old female with LLL PNA and septic shock, concern for aspiration, respiratory failure.   Vent dependent respiratory failure Left lower lobe pneumonia, aspiration ARDS. P/F ratio 200 Continue vent support 6 cc/kg tidal volume Reduce Solu-Medrol to 40 mg every 12 On cefepime, Flagyl.  Plan on 7 days of therapy Follow procalcitonin  Elevated troponin, likely demand.  EKG does not show ischemia No wall motion abnormalities on echo. Troponin is trending down. Resume Lasix 40 mg IV every 12.  Old CVA noted on CT head NPO, chronic PEG tube for feeds Neuro monitoring  Hypernatremia Hypokalemia, low phos Replete phosphorus Start free water for elevated sodium.  Hyperglycemia with steroids Continue Lantus, SSI  Goals of care Discussed with 11/7 before intubation He would like to support her for short-term with intubation.  If he does not turnaround then he will discuss with his sister in [place]  about transitioning to comfort Current CODE STATUS is a DNR.  Disposition / Summary of Today's Plan 12/08/17   Continue supportive care.  Goals of care discussion with son today.    Diet: N.p.o. Pain/Anxiety/Delirium protocol (if indicated): Fentanyl drip. Rass goal 0 VAP protocol (if indicated): Yes DVT prophylaxis: Heparin subcu GI prophylaxis: Pepcid Hyperglycemia protocol:  Mobility: Bed Code Status: DNR Family Communication: Son updated 11/7.  No family at bedside 11/9  The patient is critically ill with  multiple organ system failure and requires high complexity decision making for assessment and support, frequent evaluation and titration of therapies, advanced monitoring, review of radiographic studies and interpretation of complex data.   Critical Care Time devoted to patient care services, exclusive of separately billable procedures, described in this note is 40 minutes.   Marshell Garfinkel MD [facility] Pager [phone] If no answer or after 3pm call: [phone] 12/08/2017, 8:55 AM

## 2017-12-08 NOTE — Progress Notes (Signed)
Family at bs for extubation. Emotional support provided. Patient appearing relaxed and in no distress.

## 2017-12-08 NOTE — Progress Notes (Signed)
[place]  Notified of pt's co2=62.0, bicarb=37.8, and ph=7.3. No new orders at this time.

## 2017-12-08 NOTE — Progress Notes (Signed)
RT note-Patient extubated to comfort care per order, and family request.

## 2017-12-09 ENCOUNTER — Inpatient Hospital Stay (HOSPITAL_COMMUNITY): Payer: Medicare Other

## 2017-12-09 ENCOUNTER — Encounter (HOSPITAL_COMMUNITY): Payer: Self-pay

## 2017-12-09 DIAGNOSIS — R402443 Other coma, without documented Glasgow coma scale score, or with partial score reported, at hospital admission: Secondary | ICD-10-CM

## 2017-12-09 LAB — GLUCOSE, CAPILLARY: GLUCOSE-CAPILLARY: 145 mg/dL — AB (ref 70–99)

## 2017-12-09 NOTE — Progress Notes (Signed)
New Morphine drip bag hang. Wasted approx 65ml Morphine via stericycle witnessed by Izora Gala C.,RN

## 2017-12-09 NOTE — Progress Notes (Signed)
Attempted to call report again at 1322. Secretary says that [employer] RN will call back for report in 20 mins.

## 2017-12-09 NOTE — Progress Notes (Signed)
Pt's son called and informed of imminent transfer at 49. Attempted to call report at 1241. Awaiting call back from [facility] RN.

## 2017-12-09 NOTE — Progress Notes (Signed)
NAME:  [name], MRN:  [id number], DOB:  Feb 12, 1930, LOS: 8 ADMISSION DATE:  12/11/2017, CONSULTATION DATE:  12/13/2017 REFERRING MD:  EDP - Plunkit, CHIEF COMPLAINT:  Respiratory failure and septic shock   Brief History   84 year old female with CVA who presents to the hospital with altered mental status. Intubated in ED. Started on Levophed for sepsis, pneumonia. Extubated 11/5 but had to be reintubated the following day for worsening respiratory distress, delirium.  Past Medical History  CVA with left sided paralysis  Significant Hospital Events   11/3 Intubation for PNA 11/5 Extuabted 11/6 Retintubated, ARDS  Consults: date of consult/date signed off & final recs:  PCCM  Procedures (surgical and bedside):  ETT 11/3>>> 11/5, 11/7 >> R femoral TLC 11/3>>> 11/7 R radial a-line 11/3>>> 11/5 Rt PICC 11/7 >>  Significant Diagnostic Tests:  CT head 11/3-old right sided CVA Chest x-ray 12/07/2017- ET tube in good position, diffuse bilateral opacities which are unchanged.  I have reviewed the images personally.  Micro Data:  Blood 11/3>>>neg Resp 11/3>>> Normal resp flora Urine 11/3>>>  Antimicrobials:  Vanc 11/3>>> 11/5 Zosyn 11/3>>> 11/5 Cefepime 11/6 >> Flagyl 11/6 >>  Subjective:  Extubated yesterday for comfort.   Looks comfortable on morphine gtt with RR 10-16 Son went home to rest for a bit    Objective   Blood pressure (!) 109/54, pulse 98, temperature 98.2 F (36.8 C), resp. rate 15, height 5\' 3"  (1.6 m), weight 72.4 kg, SpO2 (!) 72 %.        Intake/Output Summary (Last 24 hours) at 12/09/2017 1207 Last data filed at 12/09/2017 1200 Gross per 24 hour  Intake 1231.79 ml  Output 1855 ml  Net -623.21 ml   Filed Weights   12/06/17 0442 12/07/17 0426 12/08/17 0419  Weight: 72.4 kg 73.7 kg 72.4 kg    Examination: Gen:      No acute distress on morphine gtt  HEENT:  Mm moist, cyanotic  Lungs:    resps even, shallow, non labored on morphine gtt    CV:        s1s2 rrr Abd:     Soft  Ext:    1-2+   edema; adequate peripheral perfusion Neuro:  [facility] Problem list   Septic shock  Assessment & Plan:  84 year old female with LLL PNA and septic shock, concern for aspiration, respiratory failure.   Vent dependent respiratory failure Left lower lobe pneumonia, aspiration ARDS Elevated troponin, likely demand.  EKG does not show ischemia Hypernatremia Hypokalemia, low phos Hyperglycemia with steroids  DNR. Now comfort care.  Continue morphine gtt  Can tx to floor  Anticipate hospital death in hours to 1-2 days       Disposition / Summary of Today's Plan 12/09/17   Now comfort care.  Extubated yesterday.  Comfortable on morphine gtt.  Anticipate hospital death in hours to days.     Diet: N.p.o. Pain/Anxiety/Delirium protocol (if indicated): Fentanyl drip. Rass goal 0 VAP protocol (if indicated): Yes DVT prophylaxis: Heparin subcu GI prophylaxis: Pepcid Hyperglycemia protocol:  Mobility: Bed Code Status: DNR Family Communication: Son updated 11/7.  No family at bedside 11/9  The patient is critically ill with multiple organ system failure and requires high complexity decision making for assessment and support, frequent evaluation and titration of therapies, advanced monitoring, review of radiographic studies and interpretation of complex data.   Nickolas Madrid, NP 12/09/2017  12:07 PM Pager: [phone] or [phone]   

## 2017-12-09 NOTE — Progress Notes (Signed)
Patient transferred from [facility], received report from Nurse Kylie. Patient is DNR on comfort measures, on morphine drip. Family will notify

## 2017-12-09 NOTE — Progress Notes (Signed)
Nutrition Brief Note  Chart reviewed. Pt now transitioning to comfort care.  No further nutrition interventions warranted at this time.  Please re-consult as needed.    Kimberly Harris, RD, LDN, CNSC Pager [phone] After Hours Pager [phone]    

## 2017-12-10 ENCOUNTER — Encounter (HOSPITAL_COMMUNITY): Payer: Self-pay | Admitting: *Deleted

## 2017-12-10 NOTE — Care Management Important Message (Signed)
Important Message  Patient Details  Name: [name] MRN: [id number] Date of Birth: 09-10-1930   Medicare Important Message Given:  No Patient is at the end of life out of respect no IM given   Orbie Pyo 12/10/2017, 3:14 PM

## 2017-12-10 NOTE — Social Work (Signed)
CSW acknowledging pt currently comfort care. Will follow for disposition should hospice services or residential hospice become appropriate.   Alexander Mt, [place] Work [phone]

## 2017-12-11 MED ORDER — SODIUM CHLORIDE 0.9% FLUSH
10.0000 mL | Freq: Two times a day (BID) | INTRAVENOUS | Status: DC
  Administered 2017-12-11 (×2): 10 mL

## 2017-12-11 MED ORDER — LORAZEPAM 2 MG/ML IJ SOLN: 1.0000 mg | INTRAMUSCULAR | Status: DC | PRN

## 2017-12-11 MED ORDER — SODIUM CHLORIDE 0.9% FLUSH
10.0000 mL | INTRAVENOUS | Status: DC | PRN
  Administered 2017-12-11: 10 mL

## 2017-12-11 NOTE — Progress Notes (Signed)
   NAME:  [name], MRN:  [id number], DOB:  12/12/1930, LOS: 104 ADMISSION DATE:  11/29/2017, CONSULTATION DATE:  12/28/2017 REFERRING MD:  EDP - Plunkit, CHIEF COMPLAINT:  Respiratory failure and septic shock   Brief History   84 year old female with CVA who presents to the hospital with altered mental status. Intubated in ED. Started on Levophed for sepsis, pneumonia. Extubated 11/5 but had to be reintubated the following day for worsening respiratory distress, delirium.  Extubated 11/10 to comfort care  Past Medical History  CVA with left sided paralysis  Significant Hospital Events   11/3 Intubation for PNA 11/5 Extuabted 11/6 Retintubated, ARDS  Consults: date of consult/date signed off & final recs:  PCCM  Procedures (surgical and bedside):  ETT 11/3>>> 11/5, 11/7 >> 11/10 R femoral TLC 11/3>>> 11/7 R radial a-line 11/3>>> 11/5 Rt PICC 11/7 >>  Significant Diagnostic Tests:  CT head 11/3-old right sided CVA Chest x-ray 12/07/2017- ET tube in good position, diffuse bilateral opacities which are unchanged.  I have reviewed the images personally.  Micro Data:  Blood 11/3>>>neg Resp 11/3>>> Normal resp flora Urine 11/3>>>  Antimicrobials:  Vanc 11/3>>> 11/5 Zosyn 11/3>>> 11/5 Cefepime 11/6 >> Flagyl 11/6 >>  Subjective:  Remains on morphine gtt at 17 Son at bedside  Objective   Blood pressure (!) 93/37, pulse 93, temperature (!) 97.5 F (36.4 C), temperature source Oral, resp. rate 14, height 5\' 3"  (1.6 m), weight 72.4 kg, SpO2 (!) 56 %.       No intake or output data in the 24 hours ending 12/11/17 1022 Filed Weights   12/06/17 0442 12/07/17 0426 12/08/17 0419  Weight: 72.4 kg 73.7 kg 72.4 kg    Examination: General: Elderly female unresponsive   HEENT: cyanotic, dry MM Neuro:  unresponsive CV: RR IR, weak pulses PULM: periods of dry gasping followed by short apnea spells Extremities: warm/dry, generalized 2+ edema   Resolved Hospital Problem  list   Septic shock  Assessment & Plan:  84 year old female with LLL PNA and septic shock, concern for aspiration, respiratory failure.   Vent dependent respiratory failure Left lower lobe pneumonia, aspiration ARDS Elevated troponin, likely demand.  EKG does not show ischemia Hypernatremia Hypokalemia, low phos Hyperglycemia with steroids  DNR/ DNI comfort care.  Continue morphine gtt, prn boluses as needed Robinul prn  Added Ativan prn  Anticipate hospital death soon        Diet: N.p.o. Mobility: Bed Code Status: DNR Family Communication: Son at bedside, updated on plan of care.    Kennieth Rad, AGACNP-BC [facility] Pgr: [phone] or if no answer [phone] 12/11/2017, 10:29 AM

## 2017-12-12 MED ORDER — LORAZEPAM 2 MG/ML IJ SOLN: 2.0000 mg | INTRAMUSCULAR | Status: DC

## 2017-12-12 MED ORDER — LORAZEPAM 2 MG/ML IJ SOLN: 2.0000 mg | INTRAMUSCULAR | Status: DC | PRN

## 2017-12-13 ENCOUNTER — Encounter (HOSPITAL_COMMUNITY): Payer: Self-pay | Admitting: Gastroenterology

## 2017-12-16 ENCOUNTER — Telehealth: Payer: Self-pay

## 2017-12-16 NOTE — Telephone Encounter (Signed)
On 12/16/17 I received a d/c from [employer] (original). The d/c is for cremation.  The patient is a patient of Doctor Icard.   D/C will be taken to [facility] for signature.  On 12/20/17 I received the d/c back from Doctor Halford Chessman who signed the d/c for Doctor Icard. I got the d/c ready and called the funeral home to let them know the d/c is ready for pickup. I also faxed a copy to the funeral home per the funeral home request.

## 2017-12-29 NOTE — Progress Notes (Addendum)
     NAME:  [name], MRN:  [id number], DOB:  1930/07/29, LOS: 72 ADMISSION DATE:  12/19/2017, CONSULTATION DATE:  11/29/2017 REFERRING MD:  EDP - Plunkit, CHIEF COMPLAINT:  Respiratory failure and septic shock   Brief History   84 year old female with CVA who presents to the hospital with altered mental status. Intubated in ED. Started on Levophed for sepsis, pneumonia. Extubated 11/5 but had to be reintubated the following day for worsening respiratory distress, delirium.  Extubated 11/10 to comfort care  Past Medical History  CVA with left sided paralysis  Significant Hospital Events   11/3 Intubation for PNA 11/5 Extuabted 11/6 Retintubated, ARDS  Consults: date of consult/date signed off & final recs:  PCCM  Procedures (surgical and bedside):  ETT 11/3>>> 11/5, 11/7 >> 11/10 R femoral TLC 11/3>>> 11/7 R radial a-line 11/3>>> 11/5 Rt PICC 11/7 >>  Significant Diagnostic Tests:  CT head 11/3-old right sided CVA Chest x-ray 12/07/2017- ET tube in good position, diffuse bilateral opacities which are unchanged.  I have reviewed the images personally.  Micro Data:  Blood 11/3>>>neg Resp 11/3>>> Normal resp flora Urine 11/3>>>  Antimicrobials:  Vanc 11/3>>> 11/5 Zosyn 11/3>>> 11/5 Cefepime 11/6 >> Flagyl 11/6 >>  Subjective:  Morphine gtt at 20 mg/hr, only one prn morphine dose given  Objective   Blood pressure (!) 105/44, pulse 94, temperature 98.7 F (37.1 C), temperature source Oral, resp. rate 12, height 5\' 3"  (1.6 m), weight 72.4 kg, SpO2 (!) 26 %.        Intake/Output Summary (Last 24 hours) at 31-Dec-2017 1533 Last data filed at 2017-12-31 1500 Gross per 24 hour  Intake 2534.63 ml  Output 900 ml  Net 1634.63 ml   Filed Weights   12/06/17 0442 12/07/17 0426 12/08/17 0419  Weight: 72.4 kg 73.7 kg 72.4 kg    Examination: General:  Elderly female agonal and unresponsive CV: weak pulses PULM: agonal with wet breathing Extremities: warm/ moist,  anasarca  Skin: cyanotic   Resolved Hospital Problem list   Septic shock  Assessment & Plan:  84 year old female with LLL PNA and septic shock, concern for aspiration, respiratory failure.   Vent dependent respiratory failure Left lower lobe pneumonia, aspiration ARDS Elevated troponin, likely demand.  EKG does not show ischemia Hypernatremia Hypokalemia, low phos Hyperglycemia with steroids  DNR/ DNI comfort care. Death is imminent.   Continue morphine gtt with frequent boluses as needed Schedule ativan  Robinul prn     Diet: NPO Mobility: Bed Code Status: DNR Family Communication: no family at bedside 11/14.    Kennieth Rad, AGACNP-BC [facility] Pgr: [phone] or if no answer [phone] December 31, 2017, 3:33 PM

## 2017-12-29 NOTE — Death Summary Note (Signed)
DEATH SUMMARY   Patient Details  Name: [name] MRN: [id number] DOB: 07-25-1930  Admission/Discharge Information   Admit Date:  2017-12-09  Date of Death: Date of Death: 2017-12-20  Time of Death: Time of Death: 4  Length of Stay: 04-17-22  Referring Physician: Deland Pretty, MD   Reason(s) for Hospitalization  Pneumonia  Diagnoses  Preliminary cause of death:  Secondary Diagnoses (including complications and co-morbidities):  Active Problems:   Pneumonia   Acute respiratory failure with hypoxemia [facility]   Altered mental status   Brief Hospital Course (including significant findings, care, treatment, and services provided and events leading to death)  [name] is a 84 y.o. year old female who with CVA who presents to the hospital with altered mental status. Intubated in ED. Started on Levophed for sepsis, pneumonia. Extubated 11/5 but had to be reintubated the following day for worsening respiratory distress, delirium.  Extubated 11/10 to comfort care. Hospital course complicated by:   Vent dependent respiratory failure Left lower lobe pneumonia, aspiration ARDS Elevated troponin, likely demand.  EKG does not show ischemia Hypernatremia Hypokalemia, low phos Hyperglycemia with steroids  Past Medical History  CVA with left sided paralysis  Significant Hospital Events   2022/12/10 Intubation for PNA 11/5 Extuabted 11/6 Retintubated, ARDS  Consults: date of consult/date signed off & final recs:  PCCM  Procedures (surgical and bedside):  ETT 11/3>>> 11/5, 11/7 >> 11/10 R femoral TLC 11/3>>> 11/7 R radial a-line 11/3>>> 11/5 Rt PICC 11/7 >>  Significant Diagnostic Tests:  CT head 11/3-old right sided CVA Chest x-ray 12/07/2017- ET tube in good position, diffuse bilateral opacities which are unchanged.  I have reviewed the images personally.   Pertinent Labs and Studies  Significant Diagnostic Studies Ct Head Wo Contrast  Result Date:  09-Dec-2017 CLINICAL DATA:  Fall.  Decreased alertness. EXAM: CT HEAD WITHOUT CONTRAST TECHNIQUE: Contiguous axial images were obtained from the base of the skull through the vertex without intravenous contrast. COMPARISON:  None. FINDINGS: Brain: No evidence of acute infarction, hemorrhage, hydrocephalus, or extra-axial collection. Old large right MCA territory infarct involving the insula and basal ganglia with compensatory dilatation of the right lateral ventricle and Wallerian degeneration of the right cerebral peduncle. 8 mm dural-based, partially calcified lesion near the left sylvian fissure is likely a small meningioma. Mild to moderate generalized cerebral atrophy. Moderate periventricular and subcortical white matter hypodensities are nonspecific, but favored to reflect chronic microvascular ischemic changes. Vascular: Calcified atherosclerosis at the skullbase. No hyperdense vessel. Skull: Negative for fracture or focal lesion. Sinuses/Orbits: Scattered mild paranasal sinus mucosal thickening. Likely chronic partial opacification of the right mastoid air cells status post canal wall down mastoidectomy. No acute orbital finding. Other: None. IMPRESSION: 1.  No acute intracranial abnormality. 2. Old large right MCA territory infarct. Electronically Signed   By: Titus Dubin M.D.   On: 09-Dec-2017 13:44   Dg Chest Port 1 View  Result Date: 12/08/2017 CLINICAL DATA:  Acute respiratory failure EXAM: PORTABLE CHEST 1 VIEW COMPARISON:  Chest radiograph 12/07/2017 FINDINGS: Enteric tube terminates in the mid trachea. Right upper extremity PICC line tip projects over the superior vena cava. Monitoring leads overlie the patient. Stable cardiac and mediastinal contours. Similar-appearing diffuse bilateral interstitial pulmonary opacities. Small left pleural effusion. IMPRESSION: 1. Stable support apparatus. 2. Unchanged diffuse bilateral interstitial pulmonary opacities which may represent pulmonary edema or  pneumonia. 3. Small left pleural effusion. Electronically Signed   By: Lovey Newcomer M.D.   On: 12/08/2017 07:19  Dg Chest Port 1 View  Result Date: 12/07/2017 CLINICAL DATA:  Acute respiratory failure. EXAM: PORTABLE CHEST 1 VIEW COMPARISON:  12/06/2017 FINDINGS: Endotracheal tube tip is 2 cm above the carina. Right arm PICC tip is in the proximal right atrium. Diffuse edema or pneumonia pattern persists, possibly slightly improved. No worsening or new finding. Small amount of pleural fluid on the left. IMPRESSION: No radiographic change. Diffuse edema or pneumonia pattern, possibly minimally improved. Electronically Signed   By: Nelson Chimes M.D.   On: 12/07/2017 06:45   Dg Chest Port 1 View  Result Date: 12/06/2017 CLINICAL DATA:  Acute respiratory failure. EXAM: PORTABLE CHEST 1 VIEW COMPARISON:  12/05/2017 and 12/15/2017 FINDINGS: Patient is rotated to the right. Endotracheal tube unchanged and in adequate position. Right-sided PICC line has been placed with tip over the right atrium. Lungs are adequately inflated demonstrate worsening opacification of the left lung base likely slight worsening moderate size effusion with associated basilar atelectasis. There is moderate airspace opacification over the left lung slightly worse likely infection. Subtle hazy opacification over the right upper lung unchanged. Cardiomediastinal silhouette and remainder of the exam is unchanged. IMPRESSION: Worsening airspace process over the left lung and stable hazy airspace density over the right upper lung likely multifocal pneumonia. Worsening left base opacification likely moderate size effusion with associated basilar atelectasis. Endotracheal tube unchanged. Right-sided PICC line has tip over the right atrium. This could be pulled back approximately 5 cm. These results were called by telephone at the time of interpretation on 12/06/2017 at 8:12 am to patient's nurse, Nettie Elm, who verbally acknowledged these  results. Electronically Signed   By: Marin Olp M.D.   On: 12/06/2017 08:13   Dg Chest Port 1 View  Result Date: 12/05/2017 CLINICAL DATA:  Acute respiratory failure EXAM: PORTABLE CHEST 1 VIEW COMPARISON:  Chest radiograph from one day prior. FINDINGS: Endotracheal tube tip is 3.0 cm above the carina. Right rotated chest radiograph. Stable cardiomediastinal silhouette with top-normal heart size. No pneumothorax. Small bilateral pleural effusions, slightly increased on the left. Extensive patchy opacity throughout both lungs, left greater than right, with some coarsened lung markings, worsened at the left lung base. IMPRESSION: 1. Well-positioned endotracheal tube. 2. Extensive patchy and coarsened opacities throughout both lungs, worsened at the left lung base, differential includes multilobar pneumonia, ARDS or diffuse alveolar hemorrhage. 3. Small bilateral pleural effusions, slightly increased on the left. Electronically Signed   By: Ilona Sorrel M.D.   On: 12/05/2017 08:11   Dg Chest Port 1 View  Result Date: 12/04/2017 CLINICAL DATA:  Intubation. EXAM: PORTABLE CHEST 1 VIEW COMPARISON:  Portable film earlier in the day. FINDINGS: ET tube has been placed in the trachea and lies approximately 3 cm above the carina. Due to rotation the LEFT mainstem bronchus is poorly visualized. BILATERAL pulmonary opacities appear slightly worse, particularly in the upper lung zones. IMPRESSION: ET tube 3 cm above carina.  Worsening aeration. Electronically Signed   By: Staci Righter M.D.   On: 12/04/2017 18:24   Dg Chest Port 1 View  Result Date: 12/04/2017 CLINICAL DATA:  Acute respiratory failure. Hypoxia. EXAM: PORTABLE CHEST 1 VIEW COMPARISON:  12/03/2017 FINDINGS: Interval removal of endotracheal tube. Bilateral perihilar edema. Increasing infiltration in the upper lungs may represent progression of edema or superimposed pneumonia. Small bilateral pleural effusions, greater on the left. Old left rib  fractures. Calcification of the aorta. IMPRESSION: Bilateral perihilar edema with increasing infiltration in the upper lungs. Small bilateral pleural effusions. Electronically  Signed   By: Lucienne Capers M.D.   On: 12/04/2017 05:50   Dg Chest Port 1 View  Result Date: 12/03/2017 CLINICAL DATA:  Acute respiratory failure EXAM: PORTABLE CHEST 1 VIEW COMPARISON:  Yesterday FINDINGS: Stable normal heart size. Mediastinal contours are distorted by rotation. Interstitial coarsening with Kerley lines and small pleural effusion on the left. No visible pneumothorax. Patient's chin overlaps the right apex. Endotracheal tube tip between the clavicular heads and carina. IMPRESSION: Unchanged pulmonary edema with atelectasis or pneumonia at the bases. Electronically Signed   By: Monte Fantasia M.D.   On: 12/03/2017 06:38   Dg Chest Port 1 View  Result Date: 12/02/2017 CLINICAL DATA:  Intubated patient, follow-up pneumonia EXAM: PORTABLE CHEST 1 VIEW COMPARISON:  Portable chest x-ray of December 01, 2017 FINDINGS: The lungs are reasonably well inflated. Increased density persists at the left lung base. There is patchy increased density in the right infrahilar region which is more conspicuous today. Confluent airspace opacities in the mid and upper left lung are less conspicuous today. The heart is top-normal in size. The pulmonary vascularity is mildly prominent but less so than on yesterday's study. There is calcification in the wall of the thoracic aorta. The esophagogastric tube is been removed. The endotracheal tube tip projects 4.6 cm above the carina. The bony structures exhibit no acute abnormalities. IMPRESSION: Slight interval improvement in the appearance of the pulmonary interstitium suggesting some resolving interstitial edema or pneumonia. Persistent left basilar atelectasis or pneumonia with slightly increased density in the right infrahilar region today which may reflect atelectasis or pneumonia as well.  Thoracic aortic atherosclerosis. Electronically Signed   By: David  Martinique M.D.   On: 12/02/2017 07:19   Dg Chest Port 1 View  Result Date: 12/11/2017 CLINICAL DATA:  Endotracheal tube placement. EXAM: PORTABLE CHEST 1 VIEW COMPARISON:  None. FINDINGS: Heart is enlarged. Endotracheal tube terminates 5 cm above the carina. NG tube courses off the inferior border the film. Moderate pulmonary vascular congestion is present bilaterally. Ill-defined left lower lobe airspace disease is present. A left pleural effusion is suspected. IMPRESSION: 1. Satisfactory positioning of the endotracheal tube at the level of the clavicles. 2. Aortic atherosclerosis. 3. Cardiomegaly with mild interstitial edema. 4. Ill-defined left lower lobe airspace disease. While this may represent atelectasis, the finding is concerning for pneumonia. Electronically Signed   By: San Morelle M.D.   On: 11/29/2017 13:11   [place] Ekg Site Rite  Result Date: 12/05/2017 If Site Rite image not attached, placement could not be confirmed due to current cardiac rhythm.   Microbiology No results found for this or any previous visit (from the past 240 hour(s)).  Lab Basic Metabolic Panel: Recent Labs  Lab 12/07/17 0400 12/08/17 0407  NA 150* 147*  K 4.1 3.9  CL 116* 107  CO2 28 33*  GLUCOSE 200* 168*  BUN 33* 40*  CREATININE 0.83 0.85  CALCIUM 7.7* 7.9*  MG 2.1 2.0  PHOS 1.9* 2.5   Liver Function Tests: No results for input(s): AST, ALT, ALKPHOS, BILITOT, PROT, ALBUMIN in the last 168 hours. No results for input(s): LIPASE, AMYLASE in the last 168 hours. No results for input(s): AMMONIA in the last 168 hours. CBC: Recent Labs  Lab 12/07/17 0400 12/08/17 0407  WBC 17.1* 18.0*  HGB 10.2* 10.5*  HCT 35.5* 36.0  MCV 95.7 93.5  PLT 216 207   Cardiac Enzymes: Recent Labs  Lab 12/07/17 0901  TROPONINI 0.63*   Sepsis Labs: Recent Labs  Lab 12/07/17 0400 12/07/17 0901 12/08/17 0407  PROCALCITON 1.36  --   0.93  WBC 17.1*  --  18.0*  LATICACIDVEN  --  2.5*  --     Procedures/Operations   ETT 11/3>>> 11/5, 11/7 >> 11/10 R femoral TLC 11/3>>> 11/7 R radial a-line 11/3>>> 11/5 Rt PICC 11/7 >>  Garner Nash, DO [facility] 12/13/2017 3:21 PM

## 2017-12-29 DEATH — deceased

## 2018-07-27 IMAGING — XA IR REPLACE G-TUBE/COLONIC TUBE
2 series · 5 of 5 positions shown · non-contrast
Comparison: none

INDICATION: Dysphagia, stroke, accidental removal of the feeding tube

[Series 1: fl (-) angio · 1 of 1 slices shown (1 of 2)]
[im 1/1]
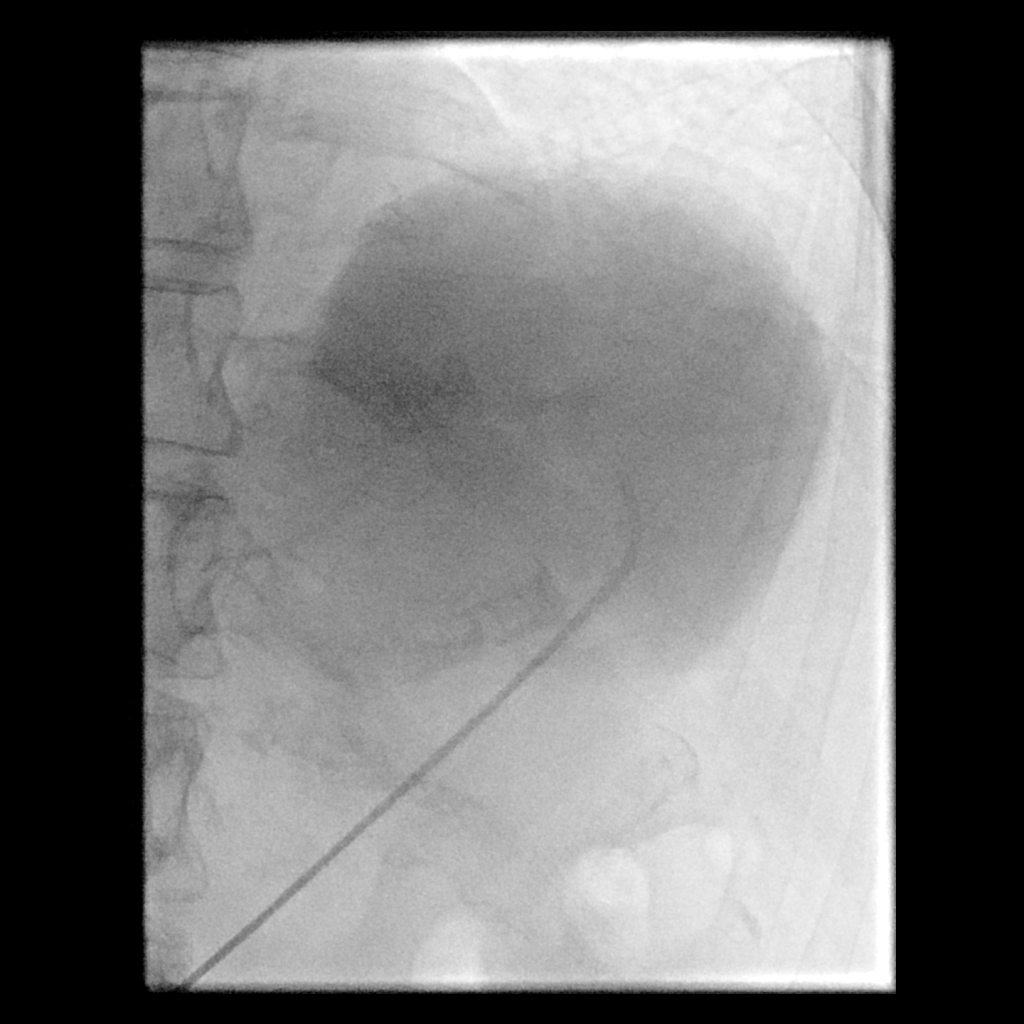

[Series 2: fl (-) angio · 4 of 4 slices shown (2 of 2)]
[im 1/4]
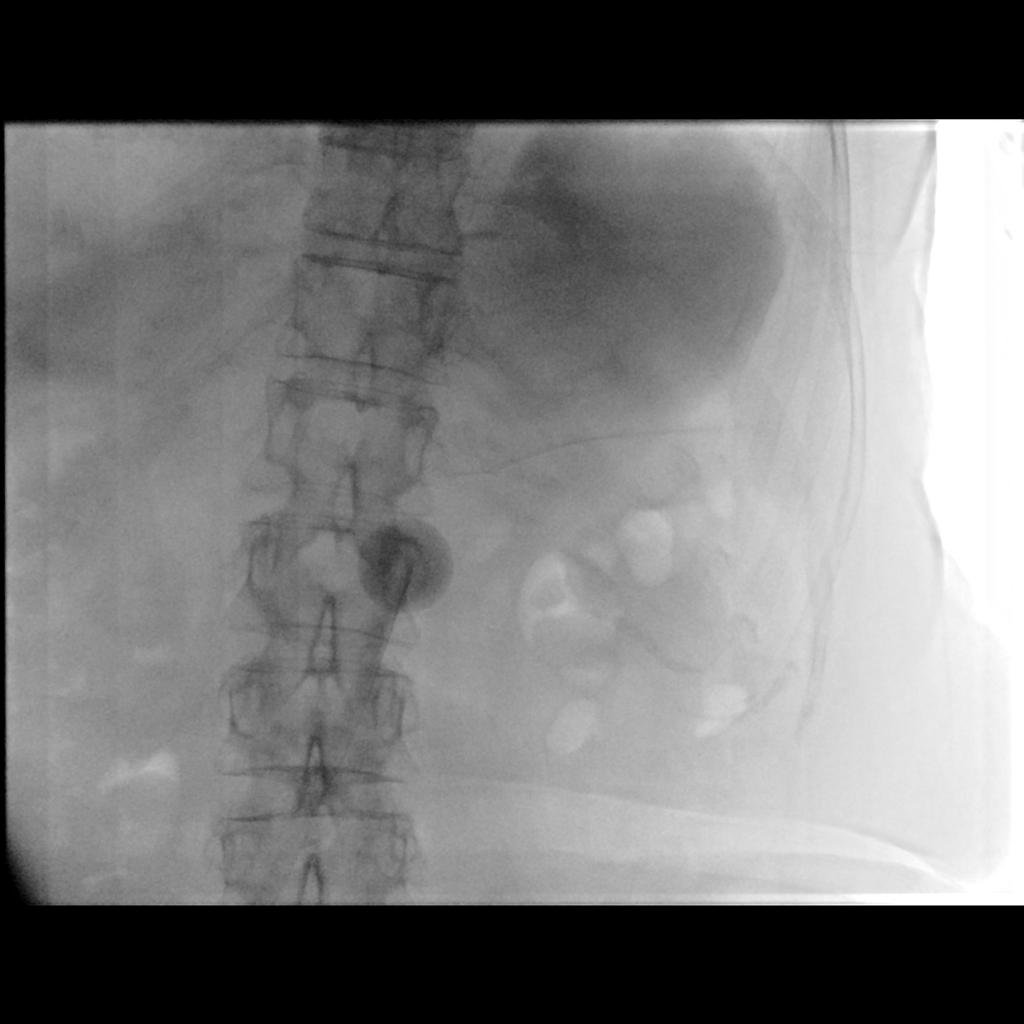
[im 2/4]
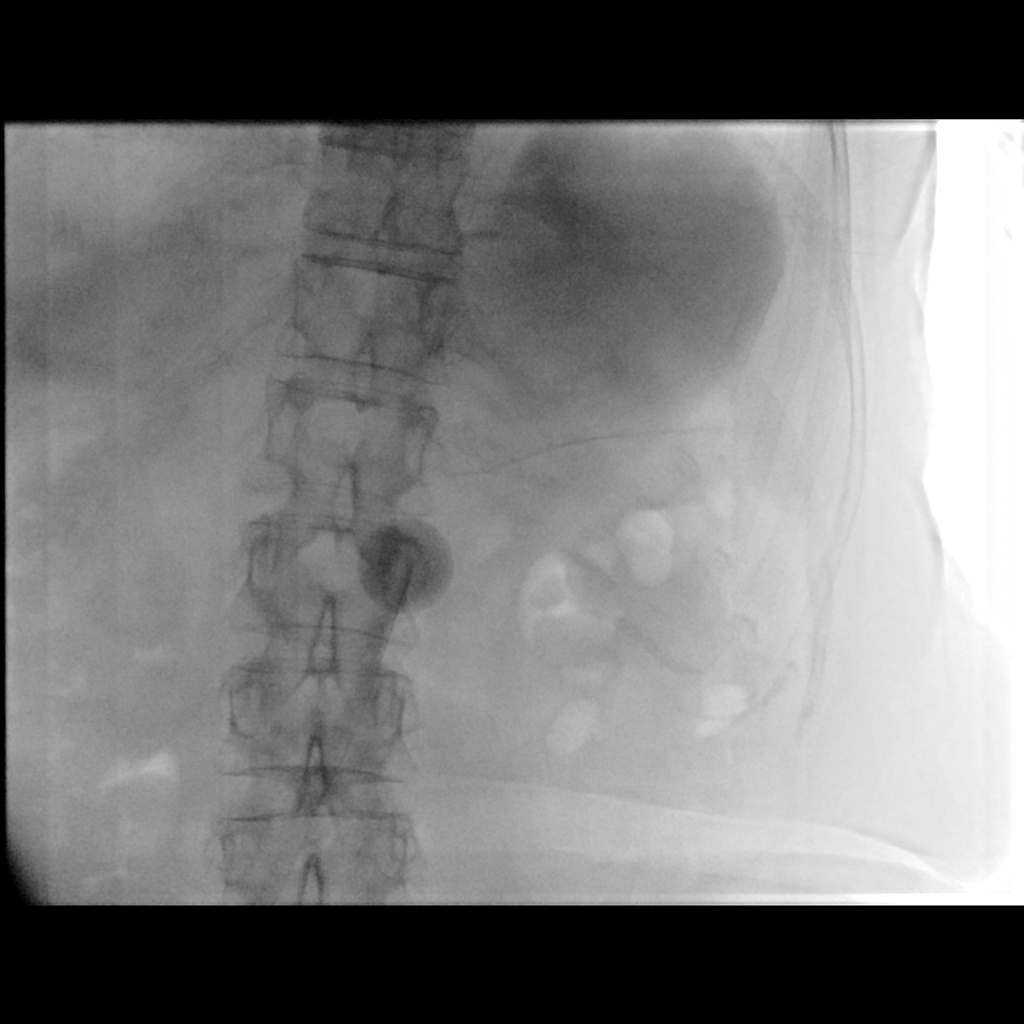
[im 3/4]
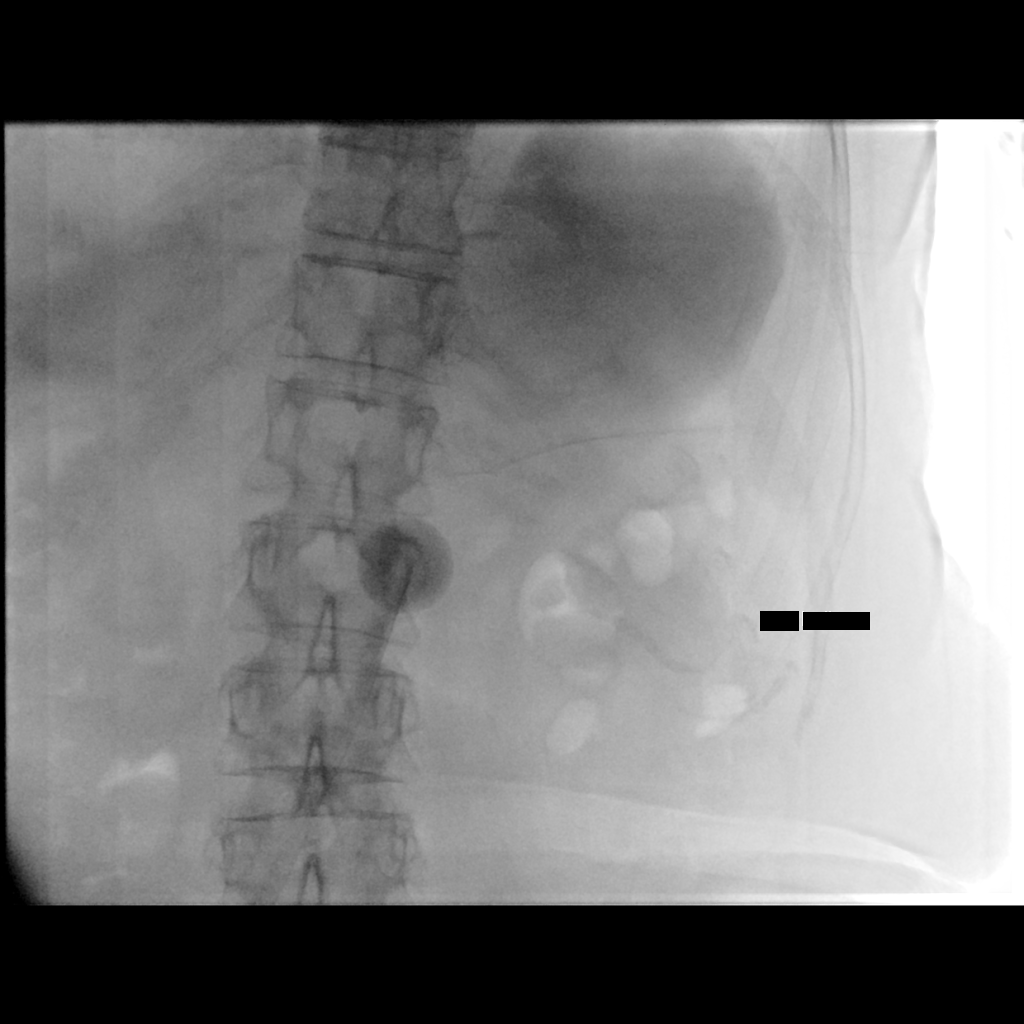
[im 4/4]
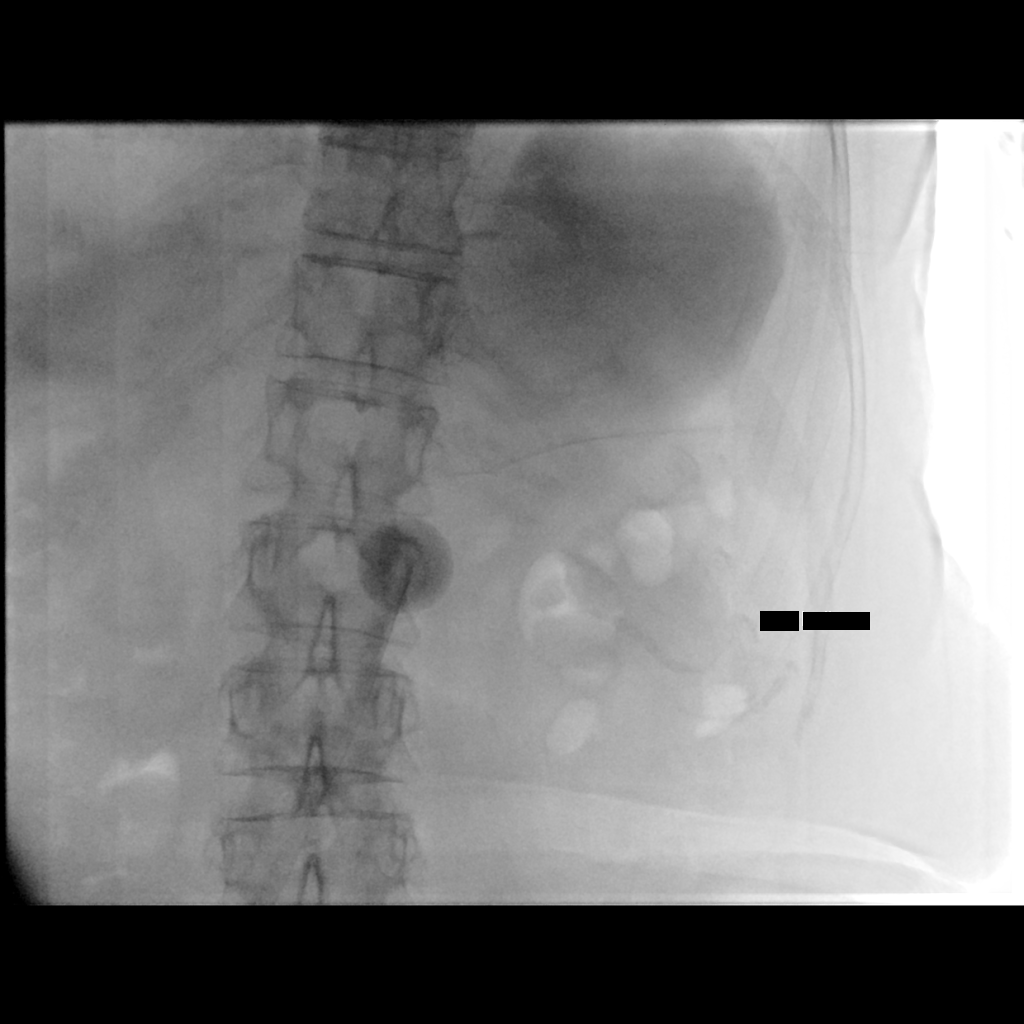

[5 of 5 positions shown; findings below may reference images not displayed]

EXAM:
GASTROSTOMY CATHETER REPLACEMENT

MEDICATIONS:
None.

ANESTHESIA/SEDATION:
None.

CONTRAST:  10 cc - administered into the gastric lumen.

FLUOROSCOPY TIME:  Fluoroscopy Time:  30 seconds (1.0 mGy).

COMPLICATIONS:
None immediate.

PROCEDURE:
Informed written consent was obtained from the patient's family
after a thorough discussion of the procedural risks, benefits and
alternatives. All questions were addressed. Maximal Sterile Barrier
Technique was utilized including caps, mask, sterile gowns, sterile
gloves, sterile drape, hand hygiene and skin antiseptic. A timeout
was performed prior to the initiation of the procedure.

Under sterile conditions, the existing percutaneous tract was
cannulated with a 5 French catheter. Contrast injection confirms
position in the stomach which is distended with gastric content.
Images obtained for documentation. Guidewire inserted followed by
advancement of an 18 French balloon retention gastrostomy into the
stomach. Retention balloon inflated with 9 cc saline containing 1 cc
contrast. This was retracted against anterior gastric wall and
secured externally. Access ready for use. No immediate complication.
IMPRESSION: Successful fluoroscopic replacement of the 18 French gastrostomy

## 2020-05-08 IMAGING — DX DG CHEST PORT 1 VIEW
1 series · 1 of 1 positions shown · non-contrast
Comparison: Yesterday

CLINICAL DATA: Acute respiratory failure

EXAM:
PORTABLE CHEST 1 VIEW

[chest ap]
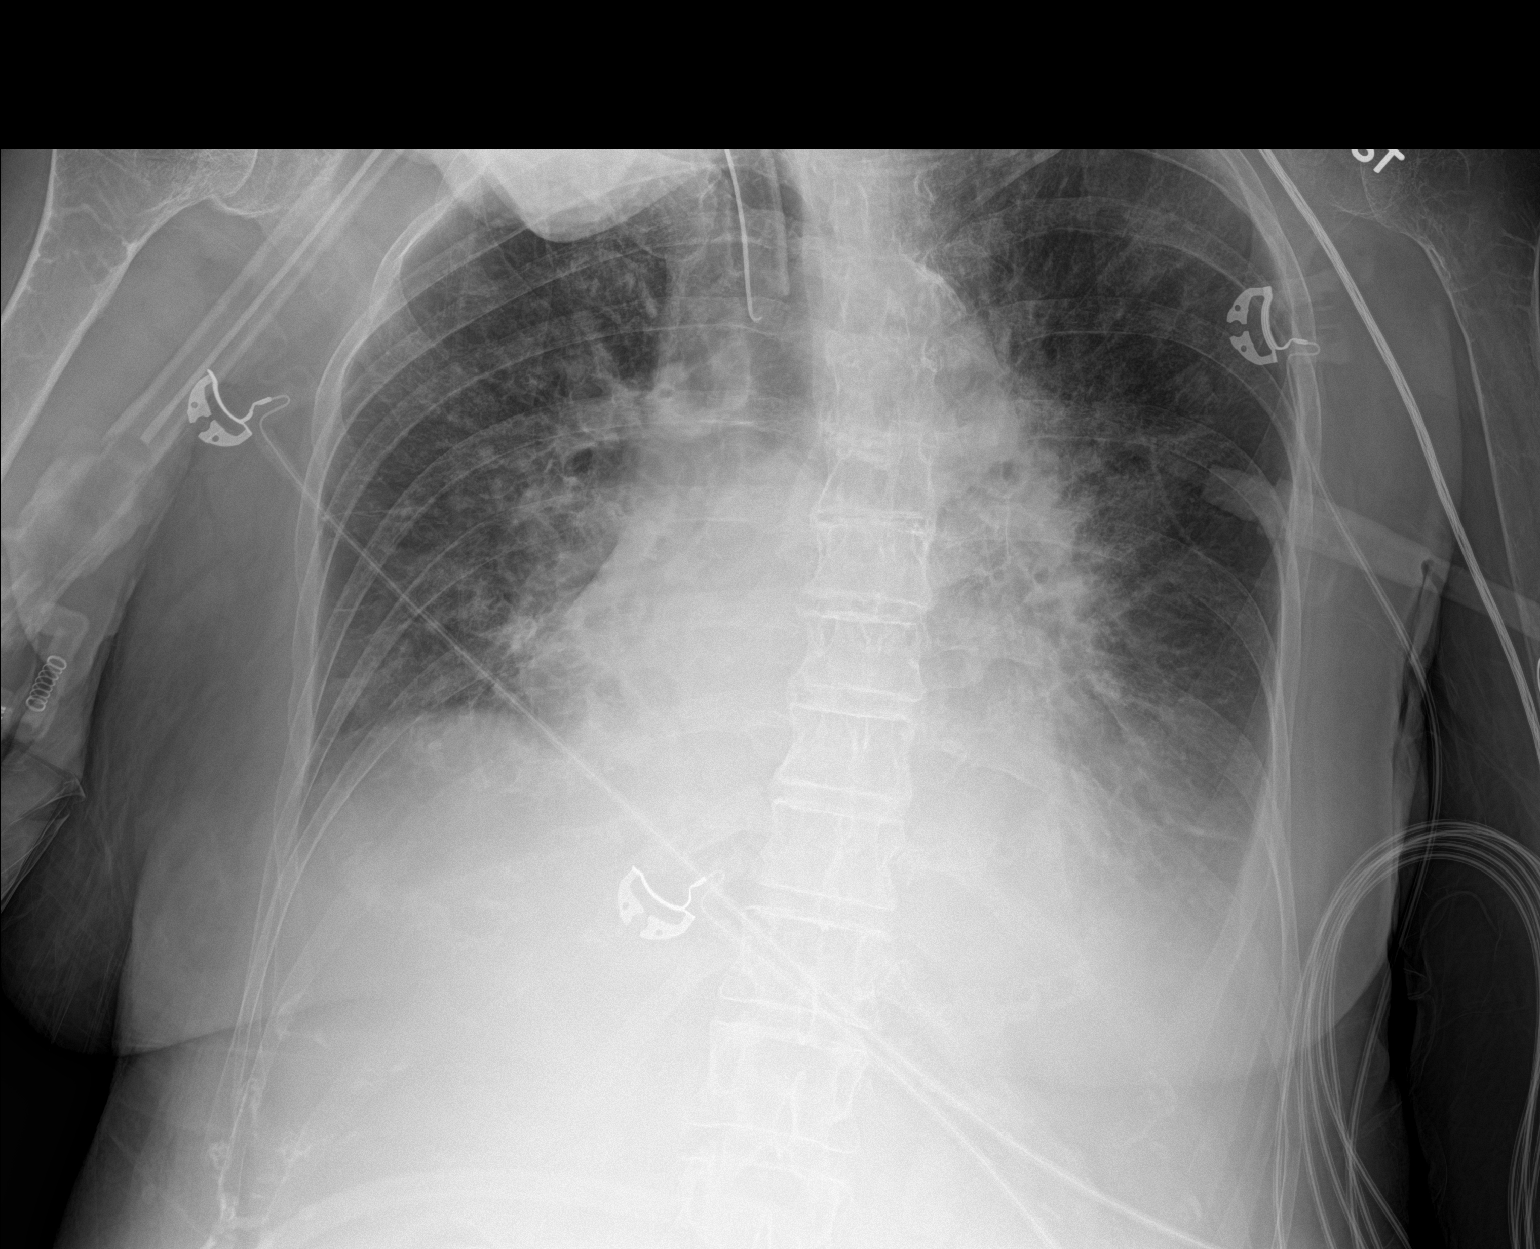

[1 of 1 positions shown; findings below may reference images not displayed]

FINDINGS: Stable normal heart size. Mediastinal contours are distorted by
rotation. Interstitial coarsening with Kerley lines and small
pleural effusion on the left. No visible pneumothorax. Patient's
chin overlaps the right apex.

Endotracheal tube tip between the clavicular heads and carina.
IMPRESSION: Unchanged pulmonary edema with atelectasis or pneumonia at the
bases.
